# Patient Record
Sex: Male | Born: 1970 | Race: White | Hispanic: No | Marital: Married | Smoking: Former smoker
Health system: Southern US, Community
[De-identification: ages and names within clinical notes are randomized; demographics above are authoritative.]

## PROBLEM LIST (undated history)

## (undated) DIAGNOSIS — G4733 Obstructive sleep apnea (adult) (pediatric): Secondary | ICD-10-CM

## (undated) DIAGNOSIS — J45909 Unspecified asthma, uncomplicated: Secondary | ICD-10-CM

## (undated) DIAGNOSIS — E669 Obesity, unspecified: Secondary | ICD-10-CM

## (undated) DIAGNOSIS — I10 Essential (primary) hypertension: Secondary | ICD-10-CM

## (undated) HISTORY — DX: Unspecified asthma, uncomplicated: J45.909

## (undated) HISTORY — PX: CARPAL TUNNEL RELEASE: SHX101

## (undated) HISTORY — PX: ADENOIDECTOMY: SUR15

## (undated) HISTORY — DX: Obstructive sleep apnea (adult) (pediatric): G47.33

## (undated) HISTORY — DX: Obesity, unspecified: E66.9

## (undated) HISTORY — PX: TONSILLECTOMY: SUR1361

## (undated) HISTORY — DX: Essential (primary) hypertension: I10

---

## 2004-10-27 ENCOUNTER — Ambulatory Visit: Payer: Self-pay | Admitting: Internal Medicine

## 2005-04-12 ENCOUNTER — Ambulatory Visit: Payer: Self-pay | Admitting: Internal Medicine

## 2005-08-08 DIAGNOSIS — I10 Essential (primary) hypertension: Secondary | ICD-10-CM

## 2005-08-08 HISTORY — DX: Essential (primary) hypertension: I10

## 2005-11-01 ENCOUNTER — Ambulatory Visit: Payer: Self-pay | Admitting: Internal Medicine

## 2005-11-01 ENCOUNTER — Encounter: Admission: RE | Admit: 2005-11-01 | Discharge: 2005-11-01 | Payer: Self-pay | Admitting: Internal Medicine

## 2006-03-24 ENCOUNTER — Ambulatory Visit: Payer: Self-pay | Admitting: Internal Medicine

## 2006-04-17 ENCOUNTER — Encounter: Admission: RE | Admit: 2006-04-17 | Discharge: 2006-07-16 | Payer: Self-pay | Admitting: Internal Medicine

## 2006-06-06 ENCOUNTER — Ambulatory Visit: Payer: Self-pay | Admitting: Internal Medicine

## 2006-06-20 ENCOUNTER — Ambulatory Visit: Payer: Self-pay | Admitting: Internal Medicine

## 2006-08-09 ENCOUNTER — Encounter: Admission: RE | Admit: 2006-08-09 | Discharge: 2006-11-07 | Payer: Self-pay | Admitting: Internal Medicine

## 2006-09-26 ENCOUNTER — Ambulatory Visit: Payer: Self-pay | Admitting: Internal Medicine

## 2006-09-30 DIAGNOSIS — E669 Obesity, unspecified: Secondary | ICD-10-CM | POA: Insufficient documentation

## 2007-07-11 ENCOUNTER — Telehealth (INDEPENDENT_AMBULATORY_CARE_PROVIDER_SITE_OTHER): Payer: Self-pay | Admitting: *Deleted

## 2007-07-11 ENCOUNTER — Encounter (INDEPENDENT_AMBULATORY_CARE_PROVIDER_SITE_OTHER): Payer: Self-pay | Admitting: *Deleted

## 2007-09-03 ENCOUNTER — Telehealth (INDEPENDENT_AMBULATORY_CARE_PROVIDER_SITE_OTHER): Payer: Self-pay | Admitting: *Deleted

## 2007-09-10 ENCOUNTER — Ambulatory Visit: Payer: Self-pay | Admitting: Internal Medicine

## 2007-09-10 DIAGNOSIS — I10 Essential (primary) hypertension: Secondary | ICD-10-CM

## 2007-12-10 ENCOUNTER — Ambulatory Visit: Payer: Self-pay | Admitting: Internal Medicine

## 2007-12-14 ENCOUNTER — Ambulatory Visit: Payer: Self-pay | Admitting: Internal Medicine

## 2007-12-18 ENCOUNTER — Telehealth: Payer: Self-pay | Admitting: Internal Medicine

## 2008-03-13 ENCOUNTER — Ambulatory Visit: Payer: Self-pay | Admitting: Internal Medicine

## 2008-03-13 LAB — CONVERTED CEMR LAB
Bilirubin Urine: NEGATIVE
Blood in Urine, dipstick: NEGATIVE
Glucose, Urine, Semiquant: NEGATIVE
Nitrite: NEGATIVE
Protein, U semiquant: NEGATIVE
Specific Gravity, Urine: <1.005
Urobilinogen, UA: 0.2
pH: 6

## 2008-03-17 ENCOUNTER — Encounter (INDEPENDENT_AMBULATORY_CARE_PROVIDER_SITE_OTHER): Payer: Self-pay | Admitting: *Deleted

## 2008-03-17 LAB — CONVERTED CEMR LAB
Basophils Absolute: 0.1 10*3/uL (ref 0.0–0.1)
Basophils Relative: 2.5 % (ref 0.0–3.0)
Monocytes Absolute: 0.4 10*3/uL (ref 0.1–1.0)
Neutro Abs: 3.4 10*3/uL (ref 1.4–7.7)
Neutrophils Relative %: 64.3 % (ref 43.0–77.0)

## 2008-04-24 ENCOUNTER — Telehealth (INDEPENDENT_AMBULATORY_CARE_PROVIDER_SITE_OTHER): Payer: Self-pay | Admitting: *Deleted

## 2008-11-10 ENCOUNTER — Telehealth (INDEPENDENT_AMBULATORY_CARE_PROVIDER_SITE_OTHER): Payer: Self-pay | Admitting: *Deleted

## 2009-07-27 ENCOUNTER — Telehealth (INDEPENDENT_AMBULATORY_CARE_PROVIDER_SITE_OTHER): Payer: Self-pay | Admitting: *Deleted

## 2009-07-28 ENCOUNTER — Ambulatory Visit: Payer: Self-pay | Admitting: Family

## 2009-08-26 ENCOUNTER — Ambulatory Visit: Payer: Self-pay | Admitting: Family

## 2009-08-26 LAB — CONVERTED CEMR LAB
AST: 28 units/L (ref 0–37)
Basophils Absolute: 0.1 10*3/uL (ref 0.0–0.1)
Bilirubin, Direct: 0 mg/dL (ref 0.0–0.3)
CO2: 32 meq/L (ref 19–32)
Chloride: 103 meq/L (ref 96–112)
Creatinine, Ser: 0.8 mg/dL (ref 0.4–1.5)
Eosinophils Relative: 1.2 % (ref 0.0–5.0)
GFR calc non Af Amer: 113.28 mL/min (ref >60)
Hemoglobin: 15.4 g/dL (ref 13.0–17.0)
MCHC: 33 g/dL (ref 30.0–36.0)
MCV: 90.4 fL (ref 78.0–100.0)
Monocytes Relative: 7.4 % (ref 3.0–12.0)
RBC: 5.16 M/uL (ref 4.22–5.81)
Sodium: 144 meq/L (ref 135–145)
TSH: 1.1 microintl units/mL (ref 0.35–5.50)
Total Protein: 7.3 g/dL (ref 6.0–8.3)
WBC: 8.6 10*3/uL (ref 4.5–10.5)

## 2009-08-27 ENCOUNTER — Encounter: Payer: Self-pay | Admitting: Family

## 2009-09-21 ENCOUNTER — Telehealth: Payer: Self-pay | Admitting: Family

## 2010-01-05 ENCOUNTER — Telehealth (INDEPENDENT_AMBULATORY_CARE_PROVIDER_SITE_OTHER): Payer: Self-pay | Admitting: *Deleted

## 2010-01-22 ENCOUNTER — Ambulatory Visit: Payer: Self-pay | Admitting: Internal Medicine

## 2010-01-26 ENCOUNTER — Ambulatory Visit: Payer: Self-pay | Admitting: Internal Medicine

## 2010-01-26 DIAGNOSIS — G4733 Obstructive sleep apnea (adult) (pediatric): Secondary | ICD-10-CM

## 2010-01-27 LAB — CONVERTED CEMR LAB
CO2: 32 meq/L (ref 19–32)
Calcium: 9.4 mg/dL (ref 8.4–10.5)
Cholesterol: 229 mg/dL — ABNORMAL HIGH (ref 0–200)
Creatinine, Ser: 0.9 mg/dL (ref 0.4–1.5)
GFR calc non Af Amer: 101.27 mL/min (ref >60)
Total CHOL/HDL Ratio: 6

## 2010-03-09 ENCOUNTER — Ambulatory Visit (HOSPITAL_BASED_OUTPATIENT_CLINIC_OR_DEPARTMENT_OTHER): Admission: RE | Admit: 2010-03-09 | Discharge: 2010-03-09 | Payer: Self-pay | Admitting: Internal Medicine

## 2010-03-09 ENCOUNTER — Encounter: Payer: Self-pay | Admitting: Internal Medicine

## 2010-03-13 ENCOUNTER — Ambulatory Visit: Payer: Self-pay | Admitting: Internal Medicine

## 2010-04-15 ENCOUNTER — Ambulatory Visit: Payer: Self-pay | Admitting: Internal Medicine

## 2010-04-15 DIAGNOSIS — J018 Other acute sinusitis: Secondary | ICD-10-CM

## 2010-04-23 ENCOUNTER — Encounter: Payer: Self-pay | Admitting: Internal Medicine

## 2010-04-27 ENCOUNTER — Encounter: Payer: Self-pay | Admitting: Internal Medicine

## 2010-05-14 ENCOUNTER — Ambulatory Visit: Payer: Self-pay | Admitting: Internal Medicine

## 2010-05-16 ENCOUNTER — Encounter: Payer: Self-pay | Admitting: Internal Medicine

## 2010-07-20 ENCOUNTER — Encounter: Payer: Self-pay | Admitting: Internal Medicine

## 2010-09-06 ENCOUNTER — Ambulatory Visit: Admit: 2010-09-06 | Payer: Self-pay | Admitting: Internal Medicine

## 2010-09-07 NOTE — Progress Notes (Signed)
Summary: Refill Request  Phone Note Refill Request Call back at [phone] Message from:  Pharmacy on Jan 05, 2010 9:29 AM  Refills Requested: Medication #1:  FELODIPINE 10 MG XR24H-TAB one tablet by mouth daily -   Dosage confirmed as above?Dosage Confirmed   Supply Requested: 1 month   Last Refilled: 11/30/2009  Medication #2:  ZESTRIL 10 MG TABS one tablet by mouth daily.   Dosage confirmed as above?Dosage Confirmed   Brand Name Necessary? No   Supply Requested: 1 month   Last Refilled: 11/30/2009 Karin Golden on [address].   Next Appointment Scheduled: 6.17.11 Initial call taken by: Harold Barban,  Jan 05, 2010 9:29 AM  Follow-up for Phone Call        has cpx 7/11....Marland KitchenMarland KitchenShary Decamp  Jan 05, 2010 2:25 PM     Prescriptions: ZESTRIL 10 MG TABS (LISINOPRIL) one tablet by mouth daily  #30 x 1   Entered by:   Shary Decamp   Authorized by:   Nolon Rod. Paz MD   Signed by:   Shary Decamp on 01/05/2010   Method used:   Electronically to        [facility] (retail)       [address]       [place], [place]  [address]       Ph: [phone]       Fax: [phone]   RxID:   [id number] FELODIPINE 10 MG XR24H-TAB (FELODIPINE) one tablet by mouth daily -  #30 x 1   Entered by:   Shary Decamp   Authorized by:   Nolon Rod. Paz MD   Signed by:   Shary Decamp on 01/05/2010   Method used:   Electronically to        [facility] (retail)       [address]       [place], [place]  [address]       Ph: [phone]       Fax: [phone]   RxID:   [id number]

## 2010-09-07 NOTE — Progress Notes (Signed)
Summary: refill request  Phone Note Refill Request Message from:  Fax from Pharmacy on September 21, 2009 10:55 AM  Refills Requested: Medication #1:  FELODIPINE 10 MG XR24H-TAB one tablet by mouth daily   Dosage confirmed as above?Dosage Confirmed   Brand Name Necessary? No   Supply Requested: 1 month   Last Refilled: 08/27/2009  Method Requested: Electronic Next Appointment Scheduled: none Initial call taken by: Roselle Locus,  September 21, 2009 10:56 AM    New/Updated Medications: FELODIPINE 10 MG XR24H-TAB (FELODIPINE) one tablet by mouth daily - NEEDS OFFICE VISIT FOR ADDITIONAL REFILLS Prescriptions: FELODIPINE 10 MG XR24H-TAB (FELODIPINE) one tablet by mouth daily - NEEDS OFFICE VISIT FOR ADDITIONAL REFILLS  #30 x 0   Entered by:   Shary Decamp   Authorized by:   Nolon Rod. Paz MD   Signed by:   Shary Decamp on 09/21/2009   Method used:   Electronically to        [facility] (retail)       [address]       [place], [place]  [address]       Ph: [phone]       Fax: [phone]   RxID:   [id number]

## 2010-09-07 NOTE — Assessment & Plan Note (Signed)
Summary: cpx/kdc   Vital Signs:  Patient profile:   40 year old male Height:      68 inches Weight:      344 pounds BMI:     52.49 Temp:     97.6 degrees F oral Pulse rate:   92 / minute BP sitting:   110 / 78  (left arm)  Vitals Entered By: Jeremy Johann CMA (January 22, 2010 8:25 AM) CC: cpx Comments --Fasting --refills REVIEWED MED LIST, PATIENT AGREED DOSE AND INSTRUCTION CORRECT    History of Present Illness: CPX his only concern today is his poor sleeping habits, wakes up through the night His wife has noted him to stop breathing from time to time, he already contacted the sleep apnea clinic and we'll see Dr. Maple Hudson  Allergies (verified): No Known Drug Allergies  Past History:  Past Medical History: Hypertension (2007) obesity  Past Surgical History: Reviewed history from 09/10/2007 and no changes required. Carpal tunnel release , bilateral adenoidectomy  Family History: Reviewed history from 09/10/2007 and no changes required. DM - GM MI - GF prostate Ca - no colon ca - no HTN - M CAD - F had cardiomegaly +smoker  Social History: Divorced, remarried  2 kids Never Smoked Alcohol use-yes exercise--  rarely , trying to do more  diet-- "not the greatest"  Review of Systems CV:  Denies chest pain or discomfort and swelling of feet. Resp:  Denies cough and shortness of breath. GI:  Denies bloody stools, diarrhea, nausea, and vomiting. GU:  Denies dysuria, nocturia, urinary frequency, and urinary hesitancy. Psych:  Denies anxiety; no sadness but has lost interest in some hobbys, thinksb/c he is tired all the time .  Physical Exam  General:  alert, well-developed, and overweight-appearing.   Neck:  no masses.   Lungs:  normal respiratory effort, no intercostal retractions, no accessory muscle use, and normal breath sounds.   Heart:  normal rate, regular rhythm, no murmur, and no gallop.   Abdomen:  soft, non-tender, no distention, no masses, no  guarding, and no rigidity.   Extremities:  trace pretibial edema bilaterally, symmetric Neurologic:  alert & oriented X3, strength normal in all extremities, and gait normal.   Psych:  Cognition and judgment appear intact. Alert and cooperative with normal attention span and concentration.  not anxious appearing and not depressed appearing.     Impression & Recommendations:  Problem # 1:  PREVENTIVE HEALTH CARE (ICD-V70.0) Td 2002 never had a colonoscopy labs I advised the patient that his main medical problem is his weight, encouraged him to change his diet and exercise more. BMI in the 50s Options: Optifast, Weight Watchers,  self diet. He is to exercise at least 30 minutes daily. Consequences of obesity discussed to have a sleep study soon, see HPI Orders: Venipuncture ([address]) TLB-Lipid Panel (80061-LIPID) TLB-BMP (Basic Metabolic Panel-BMET) (80048-METABOL)  Complete Medication List: 1)  Felodipine 10 Mg Xr24h-tab (Felodipine) .... One tablet by mouth daily - 2)  Zestril 10 Mg Tabs (Lisinopril) .... One tablet by mouth daily  Patient Instructions: 1)  Please schedule a follow-up appointment in 6 months .  Prescriptions: ZESTRIL 10 MG TABS (LISINOPRIL) one tablet by mouth daily  #90 x 3   Entered and Authorized by:   Nolon Rod. Jeree Delcid MD   Signed by:   Nolon Rod. Kayn Haymore MD on 01/22/2010   Method used:   Electronically to        [facility] (retail)  [address]       [place], [place]  [address]       Ph: [phone]       Fax: [phone]   RxID:   [id number] FELODIPINE 10 MG XR24H-TAB (FELODIPINE) one tablet by mouth daily -  #90 x 3   Entered and Authorized by:   Nolon Rod. Laith Antonelli MD   Signed by:   Nolon Rod. Taitum Menton MD on 01/22/2010   Method used:   Electronically to        [facility] (retail)       [address]       [place], [place]  [address]       Ph: [phone]       Fax:  [phone]   RxID:   [id number]

## 2010-09-07 NOTE — Miscellaneous (Signed)
Summary: CPAP Set Up/[facility]  CPAP Set Up/[facility]   Imported By: Sherian Rein 04/29/2010 11:44:06  _____________________________________________________________________  External Attachment:    Type:   Image     Comment:   External Document

## 2010-09-07 NOTE — Assessment & Plan Note (Signed)
Summary: POSSIBLE SLEEP APNEA///kp   Primary Provider/Referring Provider:  Nolon Rod. Paz MD  CC:  possible sleep apnea.  History of Present Illness: January 26, 2010- 40 yo M , self referred with long hx of fragmented sleep, daytime sleepiness, loud snoring with weight gain. He is concerned about possible sleep apnea. Wife tells him he stops breathing. Married this Spring, but says sleep has been retless all his life. Sleepy if he sits quietly, but he insists he has no problem while driving and we discussed his reponsibility to be a safe, alert driver. Bedtime 9-10 PM, short sleep latency, waking 6-8x before up between 5-6AM. 50-60 lb weight gain in 2 years but not sure how snoring changed with this. Thyroid tested normal. Adenoids out age 40 yo. Father snores.  Preventive Screening-Counseling & Management  Alcohol-Tobacco     Alcohol drinks/day: 3-4 drinks a week     Alcohol type: beer     Smoking Status: quit > 6 months     Year Quit: 2000  Current Medications (verified): 1)  Felodipine 10 Mg Xr24h-Tab (Felodipine) .... One Tablet By Mouth Daily - 2)  Zestril 10 Mg Tabs (Lisinopril) .... One Tablet By Mouth Daily  Allergies (verified): No Known Drug Allergies  Past History:  Family history reviewed for relevance to current acute and chronic problems.  family history astma FH reviewed for relevance  Family History: Reviewed history from 09/10/2007 and no changes required. 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  The patient denies shortness of breath with activity, shortness  of breath at rest, productive cough, non-productive cough, coughing up blood, chest pain, irregular heartbeats, acid heartburn, indigestion, loss of appetite, weight change, abdominal pain, difficulty swallowing, sore throat, tooth/dental problems, headaches, nasal congestion/difficulty breathing through nose, sneezing, itching, ear ache, anxiety, depression, joint stiffness or pain, rash, change in color of mucus, and fever.    Vital Signs:  Patient profile:   40 year old male Height:      68 inches Weight:      338 pounds BMI:     51.58 O2 Sat:      96 % on Room air Pulse rate:   112 / minute BP sitting:   126 / 88  (right arm) Cuff size:   large  Vitals Entered By: Kandice Hams (January 26, 2010 2:37 PM)  O2 Flow:  Room air CC: possible sleep apnea   Physical Exam  Additional Exam:  General: A/Ox3; pleasant and cooperative, NAD, overweight, alert SKIN: no rash, lesions NODES: no lymphadenopathy HEENT: [place]/AT, EOM- WNL, Conjuctivae- clear, PERRLA, TM-WNL, Nose- clear, Throat- clear and wnl, Mallampati  III-IV, tonsils not large NECK: Supple w/ fair ROM, JVD- none, normal carotid impulses w/o bruits Thyroid- normal to palpation CHEST: Clear to P&A HEART: RRR, no m/g/r heard ABDOMEN: Soft and nl; nml bowel sounds; no organomegaly or masses noted. significantly overweight ZOX:WRUE, nl pulses, no edema  NEURO: Grossly intact to observation      Impression &  Recommendations:  Problem # 1:  OBSTRUCTIVE SLEEP APNEA (ICD-327.23)  High risk for significant OSA based on Hx, Exam. We carefuly discussed the physiology, medical concerns and diagnostic workup. He agrees and we will schedule a sleep study.  Problem # 2:  OBESITY NOS (ICD-278.00) We discussed weight gain as a risk factor for sleep apnea,and a magnifier of the morbidities associated with OSA. He will need to make significant life-style changes to improve.  Other Orders: Consultation Level IV ([address]) Sleep Disorder  Referral (Sleep Disorder)  Patient Instructions: 1)  Please schedule a follow-up appointment in 1 month or later, to follow up on sleep study 2)  See [facility] to schedule sleep study.

## 2010-09-07 NOTE — Assessment & Plan Note (Signed)
Summary: rov 1 month///kp   Primary Provider/Referring Provider:  Nolon Rod. Paz MD  CC:  1 month follow up visit-sleep apnea.  History of Present Illness: History of Present Illness: January 26, 2010- 39 yo M , self referred with long hx of fragmented sleep, daytime sleepiness, loud snoring with weight gain. He is concerned about possible sleep apnea. Wife tells him he stops breathing. Married this Spring, but says sleep has been retless all his life. Sleepy if he sits quietly, but he insists he has no problem while driving and we discussed his reponsibility to be a safe, alert driver. Bedtime 9-10 PM, short sleep latency, waking 6-8x before up between 5-6AM. 50-60 lb weight gain in 2 years but not sure how snoring changed with this. Thyroid tested normal. Adenoids out age 30 yo. Father snores.  April 15, 2010- OSA,  Sleep study f/u [facility]PSG 03/09/10- AHI 99.9/hr, CPAP titrated to 21 cwp/ AHI 0.9/hr. Report reviewed with him. We discussed medical implications, treatments and CPAP options. Now on amoxacillin for a sinus infection with pressure pain now beginning to improve.   May 14, 2010- OSA cc: 1 month follow up visit-sleep apnea    CPAP is still autotitrating. He likes the CPAP "its great, no problems". Notes minor mask leak issues- discussed. He sleeps better and is no longer drowsy. Wife reported he is not snoring. Full face mask and humidifier.     Preventive Screening-Counseling & Management  Alcohol-Tobacco     Alcohol drinks/day: 3-4 drinks a week     Alcohol type: beer     Smoking Status: quit > 6 months     Year Quit: 2000  Current Medications (verified): 1)  Felodipine 10 Mg Xr24h-Tab (Felodipine) .... One Tablet By Mouth Daily - 2)  Zestril 10 Mg Tabs (Lisinopril) .... One Tablet By Mouth Daily 3)  Cpap .... Ahc Set 21cm  Allergies (verified): No Known Drug Allergies  Past History:  Past Medical History: Last updated: 04/15/2010 Hypertension  (2007) obesity Obstructive Sleep apnea  NPSG 03/09/10  AHI 99.9/hr, CPAP 21/ AHI 0.9/hr  Past Surgical History: Last updated: 09/10/2007 Carpal tunnel release , bilateral adenoidectomy  Family History: Last updated: 09/10/2007 DM - GM MI - GF prostate Ca - no colon ca - no HTN - M CAD - F had cardiomegaly +smoker  Social History: Last updated: 01/26/2010 Divorced, remarried  4 kids Quit smoking 2000 Alcohol use-yes- 3 to 4 beers/ week exercise--  rarely , trying to do more  diet-- "not the greatest" Engineer- chips for cell phones  Risk Factors: Alcohol Use: 3-4 drinks a week (05/14/2010)  Risk Factors: Smoking Status: quit > 6 months (05/14/2010)  Review of Systems      See HPI  The patient denies shortness of breath with activity, shortness of breath at rest, productive cough, non-productive cough, coughing up blood, chest pain, irregular heartbeats, acid heartburn, indigestion, loss of appetite, weight change, abdominal pain, difficulty swallowing, sore throat, tooth/dental problems, headaches, nasal congestion/difficulty breathing through nose, and sneezing.    Vital Signs:  Patient profile:   40 year old male Height:      68 inches Weight:      343 pounds BMI:     52.34 O2 Sat:      97 % on Room air Pulse rate:   99 / minute BP sitting:   130 / 74  (right arm) Cuff size:   large  Vitals Entered By: Reynaldo Minium CMA (May 14, 2010  2:45 PM)  O2 Flow:  Room air CC: 1 month follow up visit-sleep apnea   Physical Exam  Additional Exam:  General: A/Ox3; pleasant and cooperative, NAD, overweight, alert, cheerful SKIN: no rash, lesions NODES: no lymphadenopathy HEENT: [place]/AT, EOM- WNL, Conjuctivae- clear, PERRLA, TM-WNL, Nose- congested sniffing, Throat- clear and wnl, Mallampati  III-IV, tonsils not large. Oral mucosa red from medication NECK: Supple w/ fair ROM, JVD- none, normal carotid impulses w/o bruits Thyroid- normal to palpation CHEST: Clear to  P&A HEART: RRR, no m/g/r heard ABDOMEN: Soft and nl; . significantly overweight ZOX:WRUE, nl pulses, no edema  NEURO: Grossly intact to observation      Impression & Recommendations:  Problem # 1:  OBSTRUCTIVE SLEEP APNEA (ICD-327.23)  Good compliance and control  When the download is available, I will switch to a fixed cpap setting as discussed. He will let us know it there are problems.l  Problem # 2:  OBESITY NOS (ICD-278.00)  Weight loss is encouraged, as it would likey help both sleep apnea and HTN.  Medications Added to Medication List This Visit: 1)  Cpap  .... Ahc set 21cm  Other Orders: Est. Patient Level III ([address])  Patient Instructions: 1)  Please schedule a follow-up appointment in 3 months. 2)  I will probably ask the home care company to change to a fixed CPAP pressure when the download comes in (after you turn in the card to [facility]). If you don't like the changes, let me know.

## 2010-09-07 NOTE — Letter (Signed)
   [facility] [address] [place], [place] [address] [phone]    August 27, 2009   TELESFORO BROSNAHAN [address] [place], [place] [address]  RE:  LAB RESULTS  Dear  Mr. BUFANO,  The following is an interpretation of your most recent lab tests.  Please take note of any instructions provided or changes to medications that have resulted from your lab work.  ELECTROLYTES:  Good - no changes needed  KIDNEY FUNCTION TESTS:  Good - no changes needed  LIVER FUNCTION TESTS:  Good - no changes needed  THYROID STUDIES:  Thyroid studies normal TSH: 1.10     DIABETIC STUDIES:  Excellent - no changes needed Blood Glucose: 80    CBC:  Good - no changes needed   Sincerely Yours,    Lemont Fillers FNP

## 2010-09-07 NOTE — Assessment & Plan Note (Signed)
Summary: Aaron Whitehead after sleep study ///kp   Primary Provider/Referring Provider:  Nolon Rod. Paz MD  CC:  Follow up visit-review Sleep study..  History of Present Illness: January 26, 2010- 40 yo M , self referred with long hx of fragmented sleep, daytime sleepiness, loud snoring with weight gain. He is concerned about possible sleep apnea. Wife tells him he stops breathing. Married this Spring, but says sleep has been retless all his life. Sleepy if he sits quietly, but he insists he has no problem while driving and we discussed his reponsibility to be a safe, alert driver. Bedtime 9-10 PM, short sleep latency, waking 6-8x before up between 5-6AM. 50-60 lb weight gain in 2 years but not sure how snoring changed with this. Thyroid tested normal. Adenoids out age 40 yo. Father snores.  April 15, 2010- OSA,  Sleep study f/u [facility] 03/09/10- AHI 99.9/hr, CPAP titrated to 21 cwp/ AHI 0.9/hr. Report reviewed with him.We discussed medical implications, treatments and CPAP options. Now on amoxacillin for a sinus infection with pressure pain now beginning to improve.    Preventive Screening-Counseling & Management  Alcohol-Tobacco     Alcohol drinks/day: 3-4 drinks a week     Alcohol type: beer     Smoking Status: quit > 6 months     Year Quit: 2000  Current Medications (verified): 1)  Felodipine 10 Mg Xr24h-Tab (Felodipine) .... One Tablet By Mouth Daily - 2)  Zestril 10 Mg Tabs (Lisinopril) .... One Tablet By Mouth Daily  Allergies (verified): No Known Drug Allergies  Past History:  Past Surgical History: Last updated: 09/10/2007 Carpal tunnel release , bilateral adenoidectomy  Family History: Last updated: 09/10/2007 DM - GM MI - GF prostate Ca - no colon ca - no HTN - M CAD - F had cardiomegaly +smoker  Social History: Last updated: 01/26/2010 Divorced, remarried  4 kids Quit smoking 2000 Alcohol use-yes- 3 to 4 beers/ week exercise--  rarely , trying to do more  diet-- "not  the greatest" Engineer- chips for cell phones  Risk Factors: Alcohol Use: 3-4 drinks a week (04/15/2010)  Risk Factors: Smoking Status: quit > 6 months (04/15/2010)  Past Medical History: Hypertension (2007) obesity Obstructive Sleep apnea  NPSG 03/09/10  AHI 99.9/hr, CPAP 21/ AHI 0.9/hr  Review of Systems      See HPI  The patient denies anorexia, fever, weight loss, weight gain, vision loss, decreased hearing, hoarseness, chest pain, syncope, dyspnea on exertion, peripheral edema, prolonged cough, headaches, hemoptysis, abdominal pain, and severe indigestion/heartburn.    Vital Signs:  Patient profile:   40 year old male Height:      68 inches Weight:      353.13 pounds BMI:     53.89 O2 Sat:      97 % on Room air Pulse rate:   98 / minute BP sitting:   110 / 68  (left arm) Cuff size:   large  Vitals Entered By: Reynaldo Minium CMA (April 15, 2010 3:21 PM)  O2 Flow:  Room air CC: Follow up visit-review Sleep study.   Physical Exam  Additional Exam:  General: A/Ox3; pleasant and cooperative, NAD, overweight, alert SKIN: no rash, lesions NODES: no lymphadenopathy HEENT: [place]/AT, EOM- WNL, Conjuctivae- clear, PERRLA, TM-WNL, Nose- congested sniffing, Throat- clear and wnl, Mallampati  III-IV, tonsils not large. Oral mucosa red from medication NECK: Supple w/ fair ROM, JVD- none, normal carotid impulses w/o bruits Thyroid- normal to palpation CHEST: Clear to P&A HEART: RRR, no  m/g/r heard ABDOMEN: Soft and nl; nml bowel sounds; no organomegaly or masses noted. significantly overweight ZOX:WRUE, nl pulses, no edema  NEURO: Grossly intact to observation      Impression & Recommendations:  Problem # 1:  OBSTRUCTIVE SLEEP APNEA (ICD-327.23)  Severe OSA. He is intelligent and should do well with CPAP. The pressure is high enough that he will need a bilevel capable machine, but he doesn't think he had a problem with it at the sleep center so we will keep it simple to  start.  We have discussed weight loss, driving safety and alternatives to CPAP.  Problem # 2:  RHINOSINUSITIS, ACUTE (ICD-461.8) This should clear, but we will watch for related symptoms ashe starts high pressure PAP therapy.  Other Orders: Est. Patient Level III ([address]) DME Referral (DME)  Patient Instructions: 1)  Please schedule a follow-up appointment in 1 month. 2)  See the [facility] to get hooked up with a home care DME company for start with CPAP. 3)  Call if you find you need a sleep aid.

## 2010-09-07 NOTE — Letter (Signed)
Summary: [facility]  [facility]   Imported By: Lester [place] 05/04/2010 08:19:54  _____________________________________________________________________  External Attachment:    Type:   Image     Comment:   External Document
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
## 2010-10-25 ENCOUNTER — Encounter: Payer: Self-pay | Admitting: Internal Medicine

## 2010-10-25 ENCOUNTER — Ambulatory Visit (INDEPENDENT_AMBULATORY_CARE_PROVIDER_SITE_OTHER): Payer: BC Managed Care – PPO | Admitting: Internal Medicine

## 2010-10-25 DIAGNOSIS — G4733 Obstructive sleep apnea (adult) (pediatric): Secondary | ICD-10-CM

## 2010-11-04 NOTE — Assessment & Plan Note (Signed)
Summary: 3 month rov reschd from 09/06/2010//sh   Primary Provider/Referring Provider:  Nolon Rod. Paz MD  CC:  Follow up visit-sleep apnea; doing well and using CPAP every night..  History of Present Illness:  April 15, 2010- OSA,  Sleep study f/u NPSG 03/09/10- AHI 99.9/hr, CPAP titrated to 21 cwp/ AHI 0.9/hr. Report reviewed with him. We discussed medical implications, treatments and CPAP options. Now on amoxacillin for a sinus infection with pressure pain now beginning to improve.   May 14, 2010- OSA cc: 1 month follow up visit-sleep apnea    CPAP is still autotitrating. He likes the CPAP "its great, no problems". Notes minor mask leak issues- discussed. He sleeps better and is no longer drowsy. Wife reported he is not snoring. Full face mask and humidifier.   October 25, 2010- OSA Nurse-CC: Follow up visit-sleep apnea; doing well and using CPAP every night. CPAP- "great,  love it". Set at 21 CPAP with a Bilevel machine, because of high pressure requirement,  through [facility]. Full face mask. He denies issues. Says wife is happy with it.  Has joined a gym and expresses goal of weight loss.     Preventive Screening-Counseling & Management  Alcohol-Tobacco     Alcohol drinks/day: 3-4 drinks a week     Alcohol type: beer     Smoking Status: quit > 6 months     Year Quit: 2000  Current Medications (verified): 1)  Felodipine 10 Mg Xr24h-Tab (Felodipine) .... One Tablet By Mouth Daily - 2)  Zestril 10 Mg Tabs (Lisinopril) .... One Tablet By Mouth Daily 3)  Cpap .... Ahc Set 21cm  Allergies (verified): No Known Drug Allergies  Past History:  Past Medical History: Last updated: 04/15/2010 Hypertension (2007) obesity Obstructive Sleep apnea  NPSG 03/09/10  AHI 99.9/hr, CPAP 21/ AHI 0.9/hr  Past Surgical History: Last updated: 09/10/2007 Carpal tunnel release , bilateral adenoidectomy  Family History: Last updated: 09/10/2007 DM - GM MI - GF prostate Ca - no colon  ca - no HTN - M CAD - F had cardiomegaly +smoker  Social History: Last updated: 01/26/2010 Divorced, remarried  4 kids Quit smoking 2000 Alcohol use-yes- 3 to 4 beers/ week exercise--  rarely , trying to do more  diet-- "not the greatest" Engineer- chips for cell phones  Risk Factors: Alcohol Use: 3-4 drinks a week (10/25/2010)  Risk Factors: Smoking Status: quit > 6 months (10/25/2010)  Review of Systems      See HPI  The patient denies shortness of breath with activity, shortness of breath at rest, productive cough, non-productive cough, coughing up blood, chest pain, irregular heartbeats, acid heartburn, indigestion, loss of appetite, weight change, abdominal pain, difficulty swallowing, sore throat, tooth/dental problems, headaches, nasal congestion/difficulty breathing through nose, and sneezing.    Vital Signs:  Patient profile:   40 year old male Height:      68 inches Weight:      349.13 pounds BMI:     53.28 O2 Sat:      97 % on Room air Pulse rate:   90 / minute BP sitting:   122 / 80  (left arm) Cuff size:   large  Vitals Entered By: Reynaldo Minium CMA (October 25, 2010 3:34 PM)  O2 Flow:  Room air CC: Follow up visit-sleep apnea; doing well and using CPAP every night.   Physical Exam  Additional Exam:  General: A/Ox3; pleasant and cooperative, NAD,  very overweight, alert, cheerful SKIN: no rash, lesions NODES:  no lymphadenopathy HEENT: [place]/AT, EOM- WNL, Conjuctivae- clear, PERRLA, TM-WNL, Nose- congested sniffing, Throat- clear and wnl, Mallampati  III-IV, tonsils not large. Oral mucosa red from medication NECK: Supple w/ fair ROM, JVD- none, normal carotid impulses w/o bruits Thyroid- normal to palpation CHEST: Clear to P&A HEART: RRR, no m/g/r heard ABDOMEN: Soft and nl; . significantly overweight ZOX:WRUE, nl pulses, no edema  NEURO: Grossly intact to observation      Impression & Recommendations:  Problem # 1:  OBSTRUCTIVE SLEEP APNEA  (ICD-327.23)  Great compliance and control with high pressure CPAP, which he is tolerating well.   Now he needs to work on his weight. Meaningful weight loss should allow him to reduce pressures.  We talked about equipment maintnenance and he will discuss filters and such with [facility].,   Problem # 2:  RHINOSINUSITIS, ACUTE (ICD-461.8) Denies significant seasonal rhintis at this time.  Other Orders: Est. Patient Level III (45409)  Patient Instructions: 1)  Please schedule a follow-up appointment in 1 year- needed for CPAP follow-up documentation 2)  Please call sooner if needed

## 2011-01-19 ENCOUNTER — Encounter: Payer: Self-pay | Admitting: Internal Medicine

## 2011-01-20 ENCOUNTER — Ambulatory Visit (INDEPENDENT_AMBULATORY_CARE_PROVIDER_SITE_OTHER): Payer: BC Managed Care – PPO | Admitting: Internal Medicine

## 2011-01-20 ENCOUNTER — Encounter: Payer: Self-pay | Admitting: Internal Medicine

## 2011-01-20 DIAGNOSIS — H109 Unspecified conjunctivitis: Secondary | ICD-10-CM | POA: Insufficient documentation

## 2011-01-20 MED ORDER — CIPROFLOXACIN HCL 0.3 % OP SOLN: 1.0000 [drp] | OPHTHALMIC | Status: AC

## 2011-01-20 NOTE — Progress Notes (Signed)
  Subjective:    Patient ID: Aaron Whitehead, male    DOB: Dec 30, 1970, 40 y.o.   MRN: [id number]  HPI Symptoms started 4 days ago with a FB feeling in the R eye The next day he noticed redness and some discharge Yesterday the eye lid was slightly swollen and its  worse today.  Past Medical History  Diagnosis Date  . Hypertension 2007  . Obesity   . OSA (obstructive sleep apnea)     NPSG 03/09/10 AHI 99.9/hr, CPAP 21/AHI 0.9/hr   Past Surgical History  Procedure Date  . Carpal tunnel release     bilat  . Adenoidectomy      Review of Systems Has not been exposed to any FB that he recalls. Does not use contacts The eye itch and hurt a little. The vision is  slight blurred "due to the discharge". Yesterday he started to use over-the-counter allergy drops No photophobia     Objective:   Physical Exam Alert, oriented in no apparent distress EOMI, pupils equal and reactive to light and accommodation Left eye normal Right eye: Conjunctiva is red, slightly swollen, mild yellow discharge. Anterior chamber is normal, fluid is clear. Fluorescein exam showed no FB or corneal scratch       Assessment & Plan:

## 2011-01-20 NOTE — Assessment & Plan Note (Signed)
Red eye and likely from conjunctivitis, no evidence of iritis or other  serious process. See instructions He plans to leave town in a couple of days, we agreed that if he's not improving tomorrow, he will go see a  optometry is or go to urgent care.

## 2011-01-20 NOTE — Patient Instructions (Addendum)
Eye drops as prescribed Cold compress ER , UC or see a optometrist if symptoms get worse, blurred vision, swelling around the eye

## 2011-06-13 ENCOUNTER — Other Ambulatory Visit: Payer: Self-pay | Admitting: Internal Medicine

## 2011-06-13 ENCOUNTER — Ambulatory Visit (INDEPENDENT_AMBULATORY_CARE_PROVIDER_SITE_OTHER): Payer: BC Managed Care – PPO | Admitting: Internal Medicine

## 2011-06-13 VITALS — BP 138/84 | HR 103 | Temp 97.9°F | Resp 18 | Ht 68.0 in | Wt 349.1 lb

## 2011-06-13 DIAGNOSIS — J029 Acute pharyngitis, unspecified: Secondary | ICD-10-CM

## 2011-06-13 DIAGNOSIS — I10 Essential (primary) hypertension: Secondary | ICD-10-CM

## 2011-06-13 LAB — BASIC METABOLIC PANEL
BUN: 17 mg/dL (ref 6–23)
Creatinine, Ser: 1 mg/dL (ref 0.4–1.5)
GFR: 84.84 mL/min (ref >60.00)
Sodium: 144 mEq/L (ref 135–145)

## 2011-06-13 MED ORDER — AMOXICILLIN 500 MG PO CAPS: 1000.0000 mg | ORAL_CAPSULE | Freq: Two times a day (BID) | ORAL | Status: AC

## 2011-06-13 NOTE — Patient Instructions (Signed)
Rest, fluids , tylenol For cough, take Mucinex DM twice a day as needed  Take the antibiotic as prescribed ----> Amoxicillin Call if no better in few days Call anytime if the symptoms are severe, you have high fever Also, you are due for a physical exam, fasting ; please schedule at your earliest convenience

## 2011-06-13 NOTE — Assessment & Plan Note (Signed)
Will RF her meds Labs Due for a CPX , pt aware

## 2011-06-13 NOTE — Progress Notes (Signed)
  Subjective:    Patient ID: Aaron Whitehead, male    DOB: 05/06/1971, 40 y.o.   MRN: [id number]  HPI URI type symptoms on and off for 2 weeks. His daughter  had a documented episodes of strep, wife was  treated empirically as well. He is here today because yesterday he noticed a knot in the neck, mild sore throat and left-sided sinus congestion.  Past Medical History  Diagnosis Date  . Hypertension 2007  . Obesity   . OSA (obstructive sleep apnea)     NPSG 03/09/10 AHI 99.9/hr, CPAP 21/AHI 0.9/hr   Past Surgical History  Procedure Date  . Carpal tunnel release     bilat  . Adenoidectomy      Review of Systems No fever or chills Mild cough Mild postnasal dripping. Mild left ear congestion. No itchy nose or eyes.     Objective:   Physical Exam  Constitutional: He appears well-developed. No distress.  HENT:  Head: Normocephalic and atraumatic.  Right Ear: External ear normal.  Left Ear: External ear normal.       Nose is slightly congested, throat mild to moderately red without discharge. Otherwise throat is normal.  Neck:    Cardiovascular: Normal rate, regular rhythm and normal heart sounds.   No murmur heard. Pulmonary/Chest: Effort normal and breath sounds normal. No respiratory distress. He has no wheezes. He has no rales.  Skin: He is not diaphoretic.          Assessment & Plan:  Likely has pharyngitis in light of exposure to a documented strep case, throat redness and LAD See instructions

## 2011-07-11 ENCOUNTER — Ambulatory Visit (INDEPENDENT_AMBULATORY_CARE_PROVIDER_SITE_OTHER): Payer: BC Managed Care – PPO | Admitting: Internal Medicine

## 2011-07-11 ENCOUNTER — Encounter: Payer: Self-pay | Admitting: Internal Medicine

## 2011-07-11 VITALS — BP 124/80 | HR 97 | Temp 98.4°F | Resp 18 | Wt 349.0 lb

## 2011-07-11 DIAGNOSIS — R220 Localized swelling, mass and lump, head: Secondary | ICD-10-CM

## 2011-07-11 DIAGNOSIS — R221 Localized swelling, mass and lump, neck: Secondary | ICD-10-CM | POA: Insufficient documentation

## 2011-07-11 DIAGNOSIS — J329 Chronic sinusitis, unspecified: Secondary | ICD-10-CM

## 2011-07-11 MED ORDER — FLUTICASONE PROPIONATE 50 MCG/ACT NA SUSP: 2.0000 | Freq: Every day | NASAL | Status: DC

## 2011-07-11 MED ORDER — PREDNISONE 10 MG PO TABS: ORAL_TABLET | ORAL | Status: AC

## 2011-07-11 MED ORDER — MOXIFLOXACIN HCL 400 MG PO TABS: 400.0000 mg | ORAL_TABLET | Freq: Every day | ORAL | Status: DC

## 2011-07-11 NOTE — Progress Notes (Signed)
  Subjective:    Patient ID: Aaron Whitehead, male    DOB: [birth year], 40 y.o.   MRN: [id number]  HPI Was seen 06/13/2011, since then he took antibiotics as prescribed but he continued to have sinus pain and pressure, worse on the left side of his face. He also continued to have a "knot" in the left side of the neck.  Past Medical History  Diagnosis Date  . Hypertension 2007  . Obesity   . OSA (obstructive sleep apnea)     NPSG 03/09/10 AHI 99.9/hr, CPAP 21/AHI 0.9/hr   Past Surgical History  Procedure Date  . Carpal tunnel release     bilat  . Adenoidectomy      Review of Systems No fever or chills Occasionally has itchy nose and eyes Occasionally sneezes Some post nasal dripping that cause cough, mild scratchy throat    Objective:   Physical Exam  Constitutional: He appears well-developed and well-nourished.  HENT:  Head: Normocephalic and atraumatic.  Right Ear: External ear normal.  Left Ear: External ear normal.       Mild discomfort at both maxillary sinuses to palpation. Face symmetric. Nose congested. Throat without redness or discharge.  Neck:       See previous visit note, left sided LAD slightly smaller than before, soft, slightly tender. Does not seem attached to deeper structures  Cardiovascular: Normal rate, regular rhythm and normal heart sounds.   No murmur heard. Pulmonary/Chest: Effort normal and breath sounds normal. No respiratory distress. He has no wheezes. He has no rales.       Assessment & Plan:

## 2011-07-11 NOTE — Assessment & Plan Note (Addendum)
See last ov note, today the L sided neck structure (likely LAD) is slt smaller than 1.5 cm We discussed ENT referral  v. Observe ; he lected observation but will call if not back to normal after treatment  See instructions

## 2011-07-11 NOTE — Patient Instructions (Signed)
For mucus and congestion take Mucinex DM twice a day as needed  Take meds as prescribed Nasal sprays can be taken by months Call if no better in few days Call anytime if the symptoms are severe Call if the neck "knot" not improving or if resurface after treatment

## 2011-07-11 NOTE — Assessment & Plan Note (Signed)
Persistent sx x weeks, allergic v. Infectious sinusitis Plan: See instructions

## 2011-07-18 ENCOUNTER — Encounter: Payer: Self-pay | Admitting: Internal Medicine

## 2011-07-18 ENCOUNTER — Ambulatory Visit (INDEPENDENT_AMBULATORY_CARE_PROVIDER_SITE_OTHER): Payer: BC Managed Care – PPO | Admitting: Internal Medicine

## 2011-07-18 DIAGNOSIS — Z2089 Contact with and (suspected) exposure to other communicable diseases: Secondary | ICD-10-CM

## 2011-07-18 DIAGNOSIS — I10 Essential (primary) hypertension: Secondary | ICD-10-CM

## 2011-07-18 DIAGNOSIS — Z20818 Contact with and (suspected) exposure to other bacterial communicable diseases: Secondary | ICD-10-CM

## 2011-07-18 DIAGNOSIS — Z20828 Contact with and (suspected) exposure to other viral communicable diseases: Secondary | ICD-10-CM

## 2011-07-18 DIAGNOSIS — J069 Acute upper respiratory infection, unspecified: Secondary | ICD-10-CM

## 2011-07-18 DIAGNOSIS — R05 Cough: Secondary | ICD-10-CM

## 2011-07-18 LAB — POCT RAPID STREP A (OFFICE): Rapid Strep A Screen: NEGATIVE

## 2011-07-18 LAB — CBC WITH DIFFERENTIAL/PLATELET
Basophils Absolute: 0.1 10*3/uL (ref 0.0–0.1)
Basophils Relative: 0.8 % (ref 0.0–3.0)
Eosinophils Absolute: 0.1 10*3/uL (ref 0.0–0.7)
Eosinophils Relative: 0.7 % (ref 0.0–5.0)
Lymphs Abs: 3.1 10*3/uL (ref 0.7–4.0)
MCV: 89.3 fl (ref 78.0–100.0)
Monocytes Absolute: 0.8 10*3/uL (ref 0.1–1.0)
Monocytes Relative: 5.4 % (ref 3.0–12.0)
Neutro Abs: 10.7 10*3/uL — ABNORMAL HIGH (ref 1.4–7.7)
RBC: 5.11 Mil/uL (ref 4.22–5.81)
WBC: 14.8 10*3/uL — ABNORMAL HIGH (ref 4.5–10.5)

## 2011-07-18 LAB — POCT INFLUENZA A/B: Influenza A, POC: NEGATIVE

## 2011-07-18 MED ORDER — OSELTAMIVIR PHOSPHATE 75 MG PO CAPS: 75.0000 mg | ORAL_CAPSULE | Freq: Every day | ORAL | Status: AC

## 2011-07-18 MED ORDER — LOSARTAN POTASSIUM 50 MG PO TABS: 50.0000 mg | ORAL_TABLET | Freq: Every day | ORAL | Status: DC

## 2011-07-18 NOTE — Progress Notes (Signed)
  Subjective:    Patient ID: Aaron Whitehead, male    DOB: 02/24/1971, 40 y.o.   MRN: [id number]  HPI SINUS SYMPTOMS: Onset/symptoms:onset 2 mos ago as "cold" which resolved incompletely Exposures (illness/environmental/extrinsic):daughter had strep Progression of symptoms:to L frontal & facial pressure & LA in L cervical chain Treatments/response:Amox originally but recurrence of above; Avelox  & steroids Rxed last week Present symptoms: Fever/chills/sweats:sweats & chills Frontal headache:yes Facial pain:yes Nasal purulence:no Sore throat:minor Dental pain:L max & mandibular teeth Lymphadenopathy:persistent Wheezing/shortness of breath:SOB  With minor exertion Cough/sputum/hemoptysis:tickling , dry cough X 3 days. Note: on ACE-I Pleuritic pain:discomfort L > R inferior rib cage  Associated extrinsic/allergic symptoms:itchy eyes/ sneezing:better with Flonase Past medical history: Seasonal allergies:previously /asthma:as child Smoking history:quit @ age 89.  Another daughter was diagnosed with strep today. 2 daughters have flu. He has not taken the flu shot as he feels it has made him ill in the past.           Review of Systems     Objective:   Physical Exam General appearance is of good health and nourishment; no acute distress or increased work of breathing is present.  No  lymphadenopathy about the head, neck, or axilla noted.   Eyes: No conjunctival inflammation or lid edema is present.EOMI & vision intact   Ears:  External ear exam shows no significant lesions or deformities.  Otoscopic examination reveals clear canals, tympanic membranes are intact bilaterally without bulging, retraction, inflammation or discharge. Minor vascular accentuation L TM  Nose:  External nasal examination shows no deformity .Significant  Inflammation with edema L nare.. No septal dislocation .L nare obstruction to airflow.   Oral exam: Dental hygiene is good; lips and gums are healthy  appearing.There is mild  oropharyngeal erythema ; w/o  exudate   Neck:  No deformities, thyromegaly, masses, or tenderness noted.   Supple with full range of motion without pain.   Heart:  Normal rate and regular rhythm. S1 and S2 normal without gallop, murmur, click, rub S 4 Lungs:Chest clear to auscultation; no wheezes, rhonchi,rales ,or rubs present.No increased work of breathing.    Extremities:  No cyanosis  or clubbing  noted . Trace edema @ sock line   Skin: Warm & dry w/o jaundice or tenting.          Assessment & Plan:  #1 left frontal sinus and maxillary sinus pressure without purulent discharge. Status post courses of amoxicillin and Avelox.  #2 cough, nonproductive in the context of ACE inhibitor therapy.  #3 exposure to flu and strep. He has not been immunized against flu.  Plan, discussion: The Avelox should have  treated resistant organisms which amoxicillin may not. Sinus CT should be performed along with CBC and differential, rapid strep, and nasal swab for influenza A and B. The risk of not taking flu shots was discussed. He will be placed on Tamiflu. Change ACE-I to Losartan

## 2011-07-18 NOTE — Patient Instructions (Signed)
Plain Mucinex for thick secretions ;force NON dairy fluids . Use a Neti pot daily as needed for sinus congestion 1 spray in each nostril twice a day as needed. Use the "crossover" technique as discussed twice a day for the [facility]

## 2011-07-19 ENCOUNTER — Ambulatory Visit (HOSPITAL_BASED_OUTPATIENT_CLINIC_OR_DEPARTMENT_OTHER)
Admission: RE | Admit: 2011-07-19 | Discharge: 2011-07-19 | Disposition: A | Discharge status: Home / Self Care | Payer: BC Managed Care – PPO | Source: Ambulatory Visit | Attending: Internal Medicine | Admitting: Internal Medicine

## 2011-07-19 DIAGNOSIS — J342 Deviated nasal septum: Secondary | ICD-10-CM | POA: Insufficient documentation

## 2011-07-19 DIAGNOSIS — J069 Acute upper respiratory infection, unspecified: Secondary | ICD-10-CM

## 2011-07-19 DIAGNOSIS — R51 Headache: Secondary | ICD-10-CM | POA: Insufficient documentation

## 2011-07-20 ENCOUNTER — Telehealth: Payer: Self-pay

## 2011-07-20 NOTE — Telephone Encounter (Signed)
Message copied by Maurice Small on Wed Jul 20, 2011  1:39 PM ------      Message from: Pecola Lawless      Created: Tue Jul 19, 2011  5:38 PM       The CAT scan does not suggest any active sinusitis. Please  report any fever or purulent (yellow or green secretions from nose). Use the nasal steroids twice a day employing  the crossover technique as discussed.If pressure symptoms  persists or progresses , I recommend ENT referral.

## 2011-07-20 NOTE — Telephone Encounter (Signed)
Patient aware of CT and lab results, patient states he is a little better.

## 2011-08-22 ENCOUNTER — Ambulatory Visit (INDEPENDENT_AMBULATORY_CARE_PROVIDER_SITE_OTHER): Payer: BC Managed Care – PPO | Admitting: Internal Medicine

## 2011-08-22 VITALS — BP 110/76 | HR 92 | Temp 98.8°F | Ht 69.0 in | Wt 348.0 lb

## 2011-08-22 DIAGNOSIS — J069 Acute upper respiratory infection, unspecified: Secondary | ICD-10-CM

## 2011-08-22 DIAGNOSIS — R221 Localized swelling, mass and lump, neck: Secondary | ICD-10-CM

## 2011-08-22 DIAGNOSIS — J351 Hypertrophy of tonsils: Secondary | ICD-10-CM

## 2011-08-22 NOTE — Patient Instructions (Signed)
Rest, fluids , tylenol For cough, take Mucinex DM twice a day as needed  Call if no better in few days Call anytime if the symptoms are severe  

## 2011-08-22 NOTE — Progress Notes (Signed)
  Subjective:    Patient ID: Aaron Whitehead, male    DOB: 01/23/1971, 41 y.o.   MRN: [id number]  HPI Acute visit Developed a cold last week: Slightly achy, runny nose, mild sore throat. Yesterday he also developed some achiness at the sinus area bilaterally.   Past Medical History: Hypertension (2007) obesity OSA (obstructive sleep apnea) NPSG 03/09/10 AHI 99.9/hr, CPAP 21/AHI 0.9/hr   Past Surgical History: Carpal tunnel release , bilateral adenoidectomy  Family History: Reviewed history from 09/10/2007 and no changes required. DM - GM MI - GF prostate Ca - no colon ca - no HTN - M CAD - F had cardiomegaly +smoker  Social History: Divorced, remarried  2 kids Never Smoked Alcohol use- socially   Review of Systems No fevers, no chills. Mild nausea no vomiting. No diarrhea My myalgias.     Objective:   Physical Exam  Constitutional: He appears well-developed. No distress.  HENT:  Head: Normocephalic and atraumatic.  Right Ear: External ear normal.  Left Ear: External ear normal.       Face is symmetric, minimal tenderness to palpation of both maxillary sinuses. Nose is slightly congested. Throat without redness, uvula midline. Right tonsil  normal-appearing, larger than the left.  Neck:       previously noted left neck lymphadenopathy  not palpated today  Cardiovascular: Normal rate, regular rhythm, normal heart sounds and intact distal pulses.   No murmur heard. Pulmonary/Chest: Effort normal and breath sounds normal. No respiratory distress. He has no wheezes. He has no rales.  Skin: He is not diaphoretic.       Assessment & Plan:  URI: Doubt acute bacterial sinusitis, conservative treatment. See instructions Enlarged right tonsil a new finding, referral to ENT

## 2011-08-23 ENCOUNTER — Encounter: Payer: Self-pay | Admitting: Internal Medicine

## 2011-08-23 NOTE — Assessment & Plan Note (Signed)
Resolved on exam today. 

## 2011-09-06 ENCOUNTER — Other Ambulatory Visit (HOSPITAL_COMMUNITY): Payer: Self-pay | Admitting: Otolaryngology

## 2011-09-23 ENCOUNTER — Inpatient Hospital Stay (HOSPITAL_COMMUNITY): Admission: RE | Admit: 2011-09-23 | Payer: BC Managed Care – PPO | Source: Ambulatory Visit

## 2011-09-30 ENCOUNTER — Ambulatory Visit (HOSPITAL_COMMUNITY): Admission: RE | Admit: 2011-09-30 | Payer: BC Managed Care – PPO | Source: Ambulatory Visit | Admitting: Otolaryngology

## 2011-09-30 ENCOUNTER — Encounter (HOSPITAL_COMMUNITY): Admission: RE | Payer: Self-pay | Source: Ambulatory Visit

## 2011-09-30 SURGERY — TONSILLECTOMY
Anesthesia: General | Laterality: Bilateral

## 2011-10-25 ENCOUNTER — Encounter: Payer: Self-pay | Admitting: Internal Medicine

## 2011-10-25 ENCOUNTER — Ambulatory Visit (INDEPENDENT_AMBULATORY_CARE_PROVIDER_SITE_OTHER): Payer: BC Managed Care – PPO | Admitting: Internal Medicine

## 2011-10-25 VITALS — BP 110/78 | HR 82 | Ht 68.0 in | Wt 328.2 lb

## 2011-10-25 DIAGNOSIS — E669 Obesity, unspecified: Secondary | ICD-10-CM

## 2011-10-25 DIAGNOSIS — G4733 Obstructive sleep apnea (adult) (pediatric): Secondary | ICD-10-CM

## 2011-10-25 NOTE — Progress Notes (Signed)
10/25/11- 79 yoM former smoker followed for OSA complicated by HBP, obesity, rhinitis LOV-10/25/10 He remains very pleased with his BiPAP set at 21/21 from [facility]. Describes frequent sinus infections over the past year. Working with an Producer, television/film/video and throat physician in [place] and improved. Tonsils became enlarged and the right one stayed enlarged so he is pending tonsillectomy. Denies recent headache, purulent discharge, sore throat, cough or wheeze. Sleeps well.  ROS-see HPI Constitutional:   No-   weight loss, night sweats, fevers, chills, fatigue, lassitude. HEENT:   No-  headaches, difficulty swallowing, tooth/dental problems, sore throat,       No-  sneezing, itching, ear ache, nasal congestion, post nasal drip,  CV:  No-   chest pain, orthopnea, PND, swelling in lower extremities, anasarca,  dizziness, palpitations Resp: No-   shortness of breath with exertion or at rest.              No-   productive cough,  No non-productive cough,  No- coughing up of blood.              No-   change in color of mucus.  No- wheezing.   Skin: No-   rash or lesions. GI:  No-   heartburn, indigestion, abdominal pain, nausea, vomiting,  GU:  MS:  No-   joint pain or swelling.   Neuro-     nothing unusual Psych:  No- change in mood or affect. No depression or anxiety.  No memory loss.  OBJ- Physical Exam General- Alert, Oriented, Affect-appropriate, Distress- none acute Skin- rash-none, lesions- none, excoriation- none Lymphadenopathy- none Head- atraumatic            Eyes- Gross vision intact, PERRLA, conjunctivae and secretions clear            Ears- Hearing, canals-normal            Nose- Clear, no-Septal dev, mucus, polyps, erosion, perforation             Throat- Mallampati II , mucosa clear , drainage- none, tonsils- prominent R Neck- flexible , trachea midline, no stridor , thyroid nl, carotid no bruit Chest - symmetrical excursion , unlabored           Heart/CV- RRR , no murmur , no  gallop  , no rub, nl s1 s2                           - JVD- none , edema- none, stasis changes- none, varices- none           Lung- clear to P&A, wheeze- none, cough- none , dullness-none, rub- none           Chest wall-  Abd-  Br/ Gen/ Rectal- Not done, not indicated Extrem- cyanosis- none, clubbing, none, atrophy- none, strength- nl Neuro- grossly intact to observation

## 2011-10-25 NOTE — Patient Instructions (Signed)
Currently your BiPAP is set at a fixed, continuous pressure of 21 cwp.  Please call as needed

## 2011-10-27 NOTE — Assessment & Plan Note (Signed)
He requires a bilevel machine because of the high pressure but sleeps well with that said both inspiration and expiration at 21. He is very pleased. Compliance and control her excellent. Weight loss would help. We discussed use of CPAP/BiPAP at time of surgery and sedation.

## 2011-10-27 NOTE — Assessment & Plan Note (Signed)
Advised weight loss. Bariatric center may be able to offer support.

## 2011-11-04 ENCOUNTER — Other Ambulatory Visit: Payer: Self-pay | Admitting: Internal Medicine

## 2011-11-07 NOTE — Telephone Encounter (Signed)
Refill done.  

## 2012-01-19 ENCOUNTER — Other Ambulatory Visit: Payer: Self-pay | Admitting: Internal Medicine

## 2012-01-20 NOTE — Telephone Encounter (Signed)
Refill done.  

## 2012-02-27 ENCOUNTER — Telehealth: Payer: Self-pay | Admitting: Internal Medicine

## 2012-02-27 DIAGNOSIS — R05 Cough: Secondary | ICD-10-CM

## 2012-02-27 DIAGNOSIS — I10 Essential (primary) hypertension: Secondary | ICD-10-CM

## 2012-02-27 MED ORDER — LOSARTAN POTASSIUM 50 MG PO TABS: 50.0000 mg | ORAL_TABLET | Freq: Every day | ORAL | Status: DC

## 2012-02-27 NOTE — Telephone Encounter (Signed)
Refill done.  

## 2012-02-27 NOTE — Telephone Encounter (Signed)
Refill: Losartan potassium 50mg  tab. Take 1 tablet by mouth daily. Qty 30. Last fill 01-16-12

## 2012-07-16 ENCOUNTER — Telehealth: Payer: Self-pay | Admitting: Internal Medicine

## 2012-07-16 ENCOUNTER — Ambulatory Visit (INDEPENDENT_AMBULATORY_CARE_PROVIDER_SITE_OTHER): Payer: BC Managed Care – PPO | Admitting: Family Medicine

## 2012-07-16 ENCOUNTER — Encounter: Payer: Self-pay | Admitting: Family Medicine

## 2012-07-16 VITALS — BP 120/70 | HR 96 | Temp 98.1°F | Wt 347.4 lb

## 2012-07-16 DIAGNOSIS — J40 Bronchitis, not specified as acute or chronic: Secondary | ICD-10-CM

## 2012-07-16 MED ORDER — METHYLPREDNISOLONE ACETATE 80 MG/ML IJ SUSP
80.0000 mg | Freq: Once | INTRAMUSCULAR | Status: AC
  Administered 2012-07-16: 80 mg via INTRAMUSCULAR

## 2012-07-16 MED ORDER — ALBUTEROL SULFATE (2.5 MG/3ML) 0.083% IN NEBU
2.5000 mg | INHALATION_SOLUTION | Freq: Once | RESPIRATORY_TRACT | Status: AC
  Administered 2012-07-16: 2.5 mg via RESPIRATORY_TRACT

## 2012-07-16 MED ORDER — PREDNISONE 10 MG PO TABS: ORAL_TABLET | ORAL | Status: DC

## 2012-07-16 MED ORDER — BECLOMETHASONE DIPROPIONATE 40 MCG/ACT IN AERS: 2.0000 | INHALATION_SPRAY | Freq: Two times a day (BID) | RESPIRATORY_TRACT | Status: DC

## 2012-07-16 MED ORDER — ALBUTEROL SULFATE HFA 108 (90 BASE) MCG/ACT IN AERS: 2.0000 | INHALATION_SPRAY | Freq: Four times a day (QID) | RESPIRATORY_TRACT | Status: DC | PRN

## 2012-07-16 NOTE — Progress Notes (Signed)
  Subjective:     Aaron Whitehead is a 41 y.o. male here for evaluation of a cough. Onset of symptoms was 1 week ago. Symptoms have been gradually worsening since that time. The cough is barky and dry and is aggravated by cold air, dust, exercise and pollens. Associated symptoms include: shortness of breath and wheezing. Patient does have a history of asthma. Patient does have a history of environmental allergens. Patient has not traveled recently. Patient does not have a history of smoking. Patient has not had a previous chest x-ray. Patient has not had a PPD done.  The following portions of the patient's history were reviewed and updated as appropriate: allergies, current medications, past family history, past medical history, past social history, past surgical history and problem list.  Review of Systems Pertinent items are noted in HPI. 2   Objective:    Oxygen saturation 96% on room air BP 120/70  Pulse 96  Temp 98.1 F (36.7 C) (Oral)  Wt 347 lb 6.4 oz (157.58 kg)  SpO2 96% General appearance: alert, cooperative, appears stated age and no distress Nose: Nares normal. Septum midline. Mucosa normal. No drainage or sinus tenderness. Throat: lips, mucosa, and tongue normal; teeth and gums normal Neck: no adenopathy, supple, symmetrical, trachea midline and thyroid not enlarged, symmetric, no tenderness/mass/nodules Lungs: diminished breath sounds bilaterally and wheezes bilaterally Heart: S1, S2 normal    Assessment:    Acute Bronchitis    Plan:    Explained lack of efficacy of antibiotics in viral disease. Antitussives per medication orders. Avoid exposure to tobacco smoke and fumes. B-agonist inhaler. Call if shortness of breath worsens, blood in sputum, change in character of cough, development of fever or chills, inability to maintain nutrition and hydration. Avoid exposure to tobacco smoke and fumes. Steroid inhaler as ordered. depo medrol and pred taper

## 2012-07-16 NOTE — Telephone Encounter (Signed)
Attempted to call patient regarding cough/congestion. No answer, left message to call office back.

## 2012-07-16 NOTE — Addendum Note (Signed)
Addended by: Arnette Norris on: 07/16/2012 11:26 AM   Modules accepted: Orders

## 2012-07-16 NOTE — Telephone Encounter (Signed)
Left message to call office

## 2012-07-16 NOTE — Patient Instructions (Signed)

## 2012-07-16 NOTE — Telephone Encounter (Signed)
Pt c/o cold symptoms with chest congestion, sob and dry cough. Pt coming in today.

## 2012-07-30 ENCOUNTER — Encounter: Payer: Self-pay | Admitting: *Deleted

## 2012-07-30 ENCOUNTER — Other Ambulatory Visit: Payer: Self-pay | Admitting: Internal Medicine

## 2012-07-30 NOTE — Telephone Encounter (Signed)
Refill done.  Letter mailed to pt to advise him to call the office to schedule a CPE .

## 2012-10-02 ENCOUNTER — Telehealth: Payer: Self-pay | Admitting: Internal Medicine

## 2012-10-02 MED ORDER — FELODIPINE ER 10 MG PO TB24: ORAL_TABLET | ORAL | Status: DC

## 2012-10-02 NOTE — Telephone Encounter (Signed)
Discussed with pt, schedule CPE 3.20.14. Refill done.

## 2012-10-02 NOTE — Telephone Encounter (Signed)
Call pt , schedule a f/u within a month. Call #[phone], no RF

## 2012-10-02 NOTE — Telephone Encounter (Signed)
Pt has not had a ROV in over a year. Pt was made aware 12.23.13 that he was due for an OV. Pt has no future appts. OK to refill?

## 2012-10-02 NOTE — Telephone Encounter (Signed)
refill  Felodipine ER 10 MG take ONE TABLET BY MOUTH DAILY  #30 last fill 12.23.13

## 2012-10-03 ENCOUNTER — Encounter (INDEPENDENT_AMBULATORY_CARE_PROVIDER_SITE_OTHER): Payer: Self-pay | Admitting: General Surgery

## 2012-10-03 ENCOUNTER — Ambulatory Visit (INDEPENDENT_AMBULATORY_CARE_PROVIDER_SITE_OTHER): Payer: BC Managed Care – PPO | Admitting: General Surgery

## 2012-10-03 DIAGNOSIS — J45909 Unspecified asthma, uncomplicated: Secondary | ICD-10-CM

## 2012-10-03 DIAGNOSIS — Z6841 Body Mass Index (BMI) 40.0 and over, adult: Secondary | ICD-10-CM

## 2012-10-03 DIAGNOSIS — I10 Essential (primary) hypertension: Secondary | ICD-10-CM

## 2012-10-03 NOTE — Progress Notes (Signed)
Patient ID: Aaron Whitehead, male   DOB: Oct 02, 1970, 42 y.o.   MRN: [id number]  Chief Complaint  Patient presents with  . Bariatric Pre-op    Initial    HPI Aaron Whitehead is a 42 y.o. male.  This patient presents for his initial weight loss surgery evaluation. He has a tender information session and is interested in a sleeve gastrectomy. He has a BMI of 54.8 with obesity related comorbidities of obstructive sleep apnea which requires CPAP, hypertension, and asthma. He has trouble with his weight "all my life". He was into mount biking for a short period and he was able to maintain his weight at that time but he has recently joined a gym and  trying to walk more frequently but really has been unable to lose the weight.  He has tried several diets with the most successful being the isogenic diet which she has tried a couple times. He lost 40 pounds one time and 30 pounds another time but again has been unable to maintain weight loss. He denies any history of heartburn or reflux. HPI  Past Medical History  Diagnosis Date  . Hypertension 2007  . Obesity   . OSA (obstructive sleep apnea)     NPSG 03/09/10 AHI 99.9/hr, CPAP 21/AHI 0.9/hr    Past Surgical History  Procedure Laterality Date  . Carpal tunnel release      bilat  . Adenoidectomy      Family History  Problem Relation Age of Onset  . Diabetes      GM  . Heart attack      GF  . Prostate cancer Neg Hx   . Colon cancer Neg Hx   . Hypertension Mother   . Coronary artery disease Father     had cardiomegaly +smoker     Social History History  Substance Use Topics  . Smoking status: Former Games developer  . Smokeless tobacco: Never Used  . Alcohol Use: Yes    No Known Allergies  Current Outpatient Prescriptions  Medication Sig Dispense Refill  . albuterol (PROAIR HFA) 108 (90 BASE) MCG/ACT inhaler Inhale 2 puffs into the lungs every 6 (six) hours as needed for wheezing.      . beclomethasone (QVAR) 40 MCG/ACT inhaler Inhale 2  puffs into the lungs 2 (two) times daily.  1 Inhaler  12  . felodipine (PLENDIL) 10 MG 24 hr tablet take ONE TABLET BY MOUTH DAILY -  30 tablet  0  . fluticasone (FLONASE) 50 MCG/ACT nasal spray PLACE 2 SPRAYS INTO THE NOSE DAILY.  16 g  2  . losartan (COZAAR) 50 MG tablet Take 1 tablet (50 mg total) by mouth daily.  30 tablet  5  . Multiple Vitamin (MULTIVITAMIN) tablet Take 1 tablet by mouth daily.      . predniSONE (DELTASONE) 10 MG tablet 3 po qd for 3 days then 2 po qd for 3 days the 1 po qd for 3 days  18 tablet  0   No current facility-administered medications for this visit.    Review of Systems Review of Systems All other review of systems negative or noncontributory except as stated in the HPI  Blood pressure 146/64, pulse 88, temperature 97.4 F (36.3 C), temperature source Temporal, resp. rate 16, height 5\' 7"  (1.702 m), weight 350 lb 3.2 oz (158.85 kg).  Physical Exam Physical Exam Physical Exam  Vitals reviewed. Constitutional: He is oriented to person, place, and time. He appears well-developed and well-nourished. No distress.  HENT:  Head: Normocephalic and atraumatic.  Mouth/Throat: No oropharyngeal exudate.  Eyes: Conjunctivae and EOM are normal. Pupils are equal, round, and reactive to light. Right eye exhibits no discharge. Left eye exhibits no discharge. No scleral icterus.  Neck: Normal range of motion. No tracheal deviation present.  Cardiovascular: Normal rate, regular rhythm and normal heart sounds.   Pulmonary/Chest: Effort normal and breath sounds normal. No stridor. No respiratory distress. He has no wheezes. He has no rales. He exhibits no tenderness.  Abdominal: Soft. Bowel sounds are normal. He exhibits no distension and no mass. There is no tenderness. There is no rebound and no guarding.  Musculoskeletal: Normal range of motion. He exhibits no edema and no tenderness.  Neurological: He is alert and oriented to person, place, and time.  Skin: Skin is  warm and dry. No rash noted. He is not diaphoretic. No erythema. No pallor.  Psychiatric: He has a normal mood and affect. His behavior is normal. Judgment and thought content normal.    Data Reviewed   Assessment    Morbid obesity with a BMI of 54.8 and obesity related comorbidities of obstructive sleep apnea, hypertension, and asthma. We had a long discussion regarding all of the nonsurgical and surgical weight loss options including the lap band, sleeve gastrectomy, and Roux-en-Y gastric bypass. He remains most interested in the vertical sleeve gastrectomy. We discussed the pros and cons of each procedure and the risks of the procedure. The risks of infection, bleeding, pain, scarring, weight regain, too little or too much weight loss, vitamin deficiencies and need for lifelong vitamin supplementation, hair loss, need for protein supplementation, leaks, stricture, reflux, food intolerance, need for reoperation and conversion to roux Y gastric bypass, need for open surgery, injury to spleen or surrounding structures, DVT's, PE, and death again discussed with the patient and the patient expressed understanding and desires to proceed with laparoscopic vertical sleeve gastrectomy, possible open, intraoperative endoscopy.     Plan    We will go ahead and set him up for his nutrition labs, psychology and nutrition evaluation and UGI and we will see him back after these are complete.        Lodema Pilot DAVID 10/03/2012, 4:13 PM

## 2012-10-18 ENCOUNTER — Ambulatory Visit (HOSPITAL_COMMUNITY): Payer: BC Managed Care – PPO

## 2012-10-19 ENCOUNTER — Encounter: Payer: Self-pay | Admitting: *Deleted

## 2012-10-19 ENCOUNTER — Encounter: Payer: BC Managed Care – PPO | Attending: General Surgery | Admitting: *Deleted

## 2012-10-19 VITALS — Ht 67.0 in | Wt 356.4 lb

## 2012-10-19 DIAGNOSIS — E669 Obesity, unspecified: Secondary | ICD-10-CM | POA: Insufficient documentation

## 2012-10-19 DIAGNOSIS — Z713 Dietary counseling and surveillance: Secondary | ICD-10-CM | POA: Insufficient documentation

## 2012-10-19 LAB — LIPID PANEL
Cholesterol: 199 mg/dL (ref 0–200)
HDL: 38 mg/dL — ABNORMAL LOW (ref >39)
LDL Cholesterol: 136 mg/dL — ABNORMAL HIGH (ref 0–99)
Total CHOL/HDL Ratio: 5.2 Ratio
Triglycerides: 126 mg/dL (ref <150)

## 2012-10-19 LAB — COMPREHENSIVE METABOLIC PANEL
Alkaline Phosphatase: 55 U/L (ref 39–117)
CO2: 28 mEq/L (ref 19–32)
Creat: 0.83 mg/dL (ref 0.50–1.35)
Glucose, Bld: 83 mg/dL (ref 70–99)
Potassium: 4.2 mEq/L (ref 3.5–5.3)
Sodium: 139 mEq/L (ref 135–145)
Total Bilirubin: 0.6 mg/dL (ref 0.3–1.2)

## 2012-10-19 LAB — CBC WITH DIFFERENTIAL/PLATELET
Basophils Absolute: 0 10*3/uL (ref 0.0–0.1)
Basophils Relative: 0 % (ref 0–1)
Eosinophils Relative: 1 % (ref 0–5)
MCHC: 34.9 g/dL (ref 30.0–36.0)
Monocytes Absolute: 0.6 10*3/uL (ref 0.1–1.0)
Neutro Abs: 5.5 10*3/uL (ref 1.7–7.7)
Neutrophils Relative %: 68 % (ref 43–77)
RBC: 5.07 MIL/uL (ref 4.22–5.81)
RDW: 14 % (ref 11.5–15.5)

## 2012-10-19 LAB — T4: T4, Total: 6.7 ug/dL (ref 5.0–12.5)

## 2012-10-19 NOTE — Patient Instructions (Addendum)
   Follow Pre-Op Nutrition Goals to prepare for Gastric Sleeve Surgery.   Call the [facility] at [phone] once you have been given your surgery date to enrolled in the Pre-Op Nutrition Class. You will need to attend this nutrition class 3-4 weeks prior to your surgery.  

## 2012-10-22 LAB — H. PYLORI ANTIBODY, IGG: H Pylori IgG: 0.52 {ISR}

## 2012-10-23 NOTE — Progress Notes (Signed)
  Pre-Op Assessment Visit:  Pre-Operative Gastric Sleeve Surgery/Supervised Weight Loss Visit  Medical Nutrition Therapy:  Appt start time: 0800   End time:  0900.  Patient was seen on 10/19/12 for Pre-Operative Gastric Sleeve Nutrition Assessment. Assessment and letter of approval faxed to [facility] Surgery Bariatric Surgery Program coordinator on 10/23/12.  Approval letter sent to [facility] center and will be available in the chart under the media tab.  TANITA  BODY COMP RESULTS  10/19/12   BMI (kg/m^2) 55.5   Fat Mass (lbs) 131.5   Fat Free Mass (lbs) 223.0   Total Body Water (lbs) 163.0   Handouts given during visit include:  Pre-Op Goals   Bariatric Surgery Protein Shakes  Samples given during visit include:   Premier Protein shake Lot: [id number]; Exp: 06/15/13 - 1 bottle Lot: [id number]; Exp: 08/20/13 - 1 bottle  Patient to call for Pre-Op and Post-Op Nutrition Education at the [facility] when surgery is scheduled.

## 2012-10-25 ENCOUNTER — Other Ambulatory Visit: Payer: Self-pay

## 2012-10-25 ENCOUNTER — Encounter: Payer: BC Managed Care – PPO | Admitting: Internal Medicine

## 2012-10-25 ENCOUNTER — Ambulatory Visit: Payer: BC Managed Care – PPO | Admitting: Internal Medicine

## 2012-10-25 ENCOUNTER — Ambulatory Visit (HOSPITAL_COMMUNITY)
Admission: RE | Admit: 2012-10-25 | Discharge: 2012-10-25 | Disposition: A | Discharge status: Home / Self Care | Payer: BC Managed Care – PPO | Source: Ambulatory Visit | Attending: General Surgery | Admitting: General Surgery

## 2012-10-25 DIAGNOSIS — I10 Essential (primary) hypertension: Secondary | ICD-10-CM | POA: Insufficient documentation

## 2012-10-25 DIAGNOSIS — G4730 Sleep apnea, unspecified: Secondary | ICD-10-CM | POA: Insufficient documentation

## 2012-10-25 DIAGNOSIS — J45909 Unspecified asthma, uncomplicated: Secondary | ICD-10-CM

## 2012-10-25 DIAGNOSIS — Z6841 Body Mass Index (BMI) 40.0 and over, adult: Secondary | ICD-10-CM | POA: Insufficient documentation

## 2012-11-07 ENCOUNTER — Encounter: Payer: Self-pay | Admitting: Internal Medicine

## 2012-11-07 ENCOUNTER — Ambulatory Visit (INDEPENDENT_AMBULATORY_CARE_PROVIDER_SITE_OTHER): Payer: BC Managed Care – PPO | Admitting: Internal Medicine

## 2012-11-07 VITALS — BP 124/76 | HR 99 | Temp 97.8°F | Wt 356.0 lb

## 2012-11-07 DIAGNOSIS — I10 Essential (primary) hypertension: Secondary | ICD-10-CM

## 2012-11-07 DIAGNOSIS — R202 Paresthesia of skin: Secondary | ICD-10-CM | POA: Insufficient documentation

## 2012-11-07 DIAGNOSIS — J45909 Unspecified asthma, uncomplicated: Secondary | ICD-10-CM

## 2012-11-07 DIAGNOSIS — R209 Unspecified disturbances of skin sensation: Secondary | ICD-10-CM

## 2012-11-07 HISTORY — DX: Unspecified asthma, uncomplicated: J45.909

## 2012-11-07 LAB — BASIC METABOLIC PANEL
Calcium: 9 mg/dL (ref 8.4–10.5)
Chloride: 100 mEq/L (ref 96–112)
Creatinine, Ser: 0.9 mg/dL (ref 0.4–1.5)

## 2012-11-07 MED ORDER — LIDOCAINE 5 % EX PTCH: 1.0000 | MEDICATED_PATCH | CUTANEOUS | Status: DC

## 2012-11-07 NOTE — Assessment & Plan Note (Addendum)
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 Multiple questions about this condition discussed. Plan: Wt loss advil lidoderm patch If no better will need ortho eval

## 2012-11-07 NOTE — Assessment & Plan Note (Addendum)
BP well-controlled, good compliance w/ medications. Plan:  Refill medicines, check a BMP. Planning to come back for a physical at some point in the near future --- Also the patient is concerned about edema, in the setting of a BMI of 55, edema is likely related to obesity, maybe also related with calcium channel blockers.  I recommended to reassess this problem once he lost some weight, consider a diuretic or change CCBs.

## 2012-11-07 NOTE — Progress Notes (Signed)
  Subjective:    Patient ID: Aaron Whitehead, male    DOB: 1970-09-08, 42 y.o.   MRN: [id number]  HPI Acute visit One month history of numbness at the side of the left tight, worse at night, getting worse for the last 2 weeks. Walking and changing positions does not affect pain; at  night sometimes he has severe pain in the area described as a "hot knife". Previously had similar symptoms that somehow decreased after he lost weight.  Hypertension, good medication compliance, no recent labs.  Past Medical History  Diagnosis Date  . Hypertension 2007  . Obesity   . OSA (obstructive sleep apnea)     NPSG 03/09/10 AHI 99.9/hr, CPAP 21/AHI 0.9/hr  . Unspecified asthma 11/07/2012   Past Surgical History  Procedure Laterality Date  . Carpal tunnel release      bilat  . Adenoidectomy    . Tonsillectomy      History   Social History  . Marital Status: Married    Spouse Name: N/A    Number of Children: 4  . Years of Education: N/A   Occupational History  . 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 Smoking status: Former Games developer  . Smokeless tobacco: Never Used  . Alcohol Use: Yes  . Drug Use: Not on file  . Sexually Active: Not on file   Other Topics Concern  . Not on file   Social History Narrative   Divorced, remarried             Review of Systems Denies any bladder or bowel incontinence, no toes paresthesias. Not back pain per se, mild tightness if anything. Not rash at the back or legs.    Objective:   Physical Exam  Skin:      BP 124/76  Pulse 99  Temp(Src) 97.8 F (36.6 C) (Oral)  Wt 356 lb (161.481 kg)  BMI 55.74 kg/m2  SpO2 97%  General -- alert, well-developed, BMI 55.    Lungs -- normal respiratory effort, no intercostal retractions, no accessory muscle use, and normal breath sounds.   Heart-- normal rate, regular rhythm, no murmur, and no gallop.   Abdomen--soft, non-tender, no distention   Extremities-- +/+++  pretibial edema bilaterally; Skin normal. Rotation of hips normal B. Question of trochanteric bursa discomfort on the L Back: Nontender to palpation, nor rash Neurologic--  alert & oriented X3. Pinprick examination showed that the area of concern has decrease sensitivity. DTRs in the lower extremities symmetric except for a decreased left knee jerk. Motor exam normal, gait normal.  Psych-- Cognition and judgment appear intact. Alert and cooperative with normal attention span and concentration.  not anxious appearing and not depressed appearing.       Assessment & Plan:  Multiple questions answered,   I spent more than 25 min with the patient, >50% of the time counseling

## 2012-11-07 NOTE — Patient Instructions (Addendum)
Motrin 200 mg 2-3  tablets every 6 hours as needed for pain. Always take it with food. Watch for stomach side effects (gastritis): nausea, stomach pain, change in the color of stools. If no better in 2 weeks or if worse, call for a referral. Schedule a physical at your convenience   Meralgia Paresthetica  Meralgia paresthetica (MP) is a disorder characterized by tingling, numbness, and burning pain in the outer side of the thigh. It occurs in men more than women. MP is generally found in middle-aged or overweight people. Sometimes, the disorder may disappear. CAUSES The disorder is caused by a nerve in the thigh being squeezed (compressed). MP may be associated with tight clothing, pregnancy, diabetes, and being overweight (obese). SYMPTOMS  Tingling, numbness, and burning in the outer thigh.  An area of the skin may be painful and sensitive to the touch. The symptoms often worsen after walking or standing. TREATMENT  Treatment is based on your symptoms and is mainly supportive. Treatment may include:  Wearing looser clothing.  Losing weight.  Avoiding prolonged standing or walking.  Taking medication.  Surgery if the pain is peristent or severe. MP usually eases or disappears after treatment. Surgery is not always fully successful. Document Released: 07/15/2002 Document Revised: 10/17/2011 Document Reviewed: 07/25/2005 [facility] Patient Information 2013 [place], [place].

## 2012-11-08 ENCOUNTER — Encounter: Payer: Self-pay | Admitting: *Deleted

## 2012-11-15 ENCOUNTER — Encounter: Payer: BC Managed Care – PPO | Attending: General Surgery | Admitting: *Deleted

## 2012-11-15 DIAGNOSIS — Z713 Dietary counseling and surveillance: Secondary | ICD-10-CM | POA: Insufficient documentation

## 2012-11-15 DIAGNOSIS — E669 Obesity, unspecified: Secondary | ICD-10-CM | POA: Insufficient documentation

## 2012-11-16 ENCOUNTER — Encounter: Payer: Self-pay | Admitting: *Deleted

## 2012-11-16 NOTE — Progress Notes (Signed)
Supervised Weight Loss Visit on 11/15/2012  Patient here from 9:30 to 10:00 AM for Group Supervised Weight Loss Visit Current weight: 351.6 pounds, indicates weight loss of 5 pounds since last visit here on 10/19/2012.  He states behavior changes include: Eating at home more often than eating out Drinking less beer which he tends to only drink in restaurants Increasing his activity level and planning to walk before work starting next week for about an hour in the AM daily. Is drinking protein shakes in addition to meals that have no more than 600 calories at a time.   Plan, continue with current behaviors and increase activity through walking as tolerated Next visit is scheduled for May 8th, 2014

## 2012-11-19 ENCOUNTER — Other Ambulatory Visit: Payer: Self-pay | Admitting: Internal Medicine

## 2012-11-20 NOTE — Telephone Encounter (Signed)
Refill done.  

## 2012-12-04 ENCOUNTER — Ambulatory Visit: Payer: BC Managed Care – PPO | Admitting: Internal Medicine

## 2012-12-13 ENCOUNTER — Encounter: Payer: BC Managed Care – PPO | Attending: General Surgery | Admitting: *Deleted

## 2012-12-13 VITALS — Ht 67.0 in | Wt 353.0 lb

## 2012-12-13 DIAGNOSIS — E669 Obesity, unspecified: Secondary | ICD-10-CM | POA: Insufficient documentation

## 2012-12-13 DIAGNOSIS — Z713 Dietary counseling and surveillance: Secondary | ICD-10-CM | POA: Insufficient documentation

## 2012-12-13 NOTE — Progress Notes (Addendum)
  Supervised Weight Loss Visit: Pre-Operative Gastric Sleeve Surgery  Medical Nutrition Therapy:  Appt start time: 0940 end time:  1000.  Primary concerns today:  Pre-Operative Bariatric Surgery Nutrition Management  Start weight:  356.4 lbs (10/19/12) Current weight: 353.0 pounds, indicates weight gain of 1.4 pounds since last visit here on 11/15/2012. Total weight loss:  3.4 lbs (needs to lose 5% of body weight to meet insurance requirement = 17.8 lbs) BMI:  55.3 kg/m^2  Recent physical activity:  Slight increase in exercise, though states a need to "do better".  Estimated daily needs (for weight loss):  1600 calories 200g CHO 80g protein 45-50g fat  Progress Towards Goal(s):  In progress.  Nutritional Diagnosis:  [place]-3.3 Obesity related to past poor dietary habits and physical inactivity as evidenced by patient attending supervised weight loss for insurance approval of bariatric surgery.    Intervention:  Nutrition education including importance of exercise both pre- and post-op bariatric surgery.  Monitoring/Evaluation:  Dietary intake, exercise, lap band fills, and body weight. Follow up in 1 months for supervised weight loss visit.

## 2012-12-13 NOTE — Patient Instructions (Addendum)
Goals:  Eat 3 meals/day, Avoid meal skipping   Have lean, protein rich foods with all carbohydrates  Limit carbohydrate 2 servings/meal and 1 serving/snack  Choose more whole grains, lean protein, low-fat dairy, and fruits/non-starchy vegetables.   Aim for >30 min of physical activity daily  Limit sugar-sweetened beverages and concentrated sweets 

## 2012-12-14 ENCOUNTER — Encounter: Payer: Self-pay | Admitting: Internal Medicine

## 2012-12-14 ENCOUNTER — Ambulatory Visit (INDEPENDENT_AMBULATORY_CARE_PROVIDER_SITE_OTHER): Payer: BC Managed Care – PPO | Admitting: Internal Medicine

## 2012-12-14 VITALS — BP 116/76 | HR 97 | Ht 68.0 in | Wt 353.0 lb

## 2012-12-14 DIAGNOSIS — G4733 Obstructive sleep apnea (adult) (pediatric): Secondary | ICD-10-CM

## 2012-12-14 NOTE — Patient Instructions (Addendum)
Order- DME [facility]- Reduce BIPAP to 19/19     Dx OSA                                        - Replacement mask of choice and supplies  Please call as needed

## 2012-12-14 NOTE — Progress Notes (Signed)
10/25/11- 61 yoM former smoker followed for OSA complicated by HBP, obesity, rhinitis LOV-10/25/10 He remains very pleased with his BiPAP set at 21/21 from advanced. Describes frequent sinus infections over the past year. Working with an Producer, television/film/video and throat physician in [place] and improved. Tonsils became enlarged and the right one stayed enlarged so he is pending tonsillectomy. Denies recent headache, purulent discharge, sore throat, cough or wheeze. Sleeps well.  12/14/12- 27 yoM former smoker followed for OSA complicated by HBP, obesity, rhinitis FOLLOWS FOR: Currently using BiPAP 21/21/[facility] every night for 6 hours. Reports needing a new mask and feels the pressure is too high. Denies problems with the machine. Tonsillectomy 2013. Feels needs lower pressure now. Not snoring through. Compliance w/ BIPAP is good. Minor pollen scratchy.  ROS-see HPI Constitutional:   No-   weight loss, night sweats, fevers, chills, fatigue, lassitude. HEENT:   No-  headaches, difficulty swallowing, tooth/dental problems, sore throat,       No-  sneezing, itching, ear ache, nasal congestion, +post nasal drip,  CV:  No-   chest pain, orthopnea, PND, swelling in lower extremities, anasarca,  dizziness, palpitations Resp: No-   shortness of breath with exertion or at rest.              No-   productive cough,  No non-productive cough,  No- coughing up of blood.              No-   change in color of mucus.  No- wheezing.   Skin: No-   rash or lesions. GI:  No-   heartburn, indigestion, abdominal pain, nausea, vomiting,  GU:  MS:  No-   joint pain or swelling.   Neuro-     nothing unusual Psych:  No- change in mood or affect. No depression or anxiety.  No memory loss.  OBJ- Physical Exam General- Alert, Oriented, Affect-appropriate, Distress- none acute. Obese Skin- rash-none, lesions- none, excoriation- none Lymphadenopathy- none Head- atraumatic            Eyes- Gross vision intact, PERRLA,  conjunctivae and secretions clear            Ears- Hearing, canals-normal            Nose- Clear, no-Septal dev, mucus, polyps, erosion, perforation             Throat- Mallampati III , mucosa clear , drainage- none, tonsils- absent Neck- flexible , trachea midline, no stridor , thyroid nl, carotid no bruit Chest - symmetrical excursion , unlabored           Heart/CV- RRR , no murmur , no gallop  , no rub, nl s1 s2                           - JVD- none , edema- none, stasis changes- none, varices- none           Lung- clear to P&A, wheeze- none, cough- none , dullness-none, rub- none           Chest wall-  Abd-  Br/ Gen/ Rectal- Not done, not indicated Extrem- cyanosis- none, clubbing, none, atrophy- none, strength- nl Neuro- grossly intact to observation

## 2012-12-14 NOTE — Assessment & Plan Note (Signed)
Good compliance and control. Weight loss would help- he is aware. Plan- Reduce pressure to 19/19. Replacement mask. Discussed options.

## 2012-12-27 ENCOUNTER — Encounter: Payer: BC Managed Care – PPO | Admitting: Internal Medicine

## 2013-01-17 ENCOUNTER — Encounter: Payer: BC Managed Care – PPO | Attending: General Surgery | Admitting: *Deleted

## 2013-01-17 VITALS — Ht 67.0 in | Wt 336.2 lb

## 2013-01-17 DIAGNOSIS — E669 Obesity, unspecified: Secondary | ICD-10-CM | POA: Insufficient documentation

## 2013-01-17 DIAGNOSIS — Z713 Dietary counseling and surveillance: Secondary | ICD-10-CM | POA: Insufficient documentation

## 2013-01-17 NOTE — Progress Notes (Signed)
  Supervised Weight Loss Visit: Pre-Operative Gastric Sleeve Surgery  Medical Nutrition Therapy:  Appt start time: 0930  End time:  1000.  Primary concerns today:  Pre-Operative Bariatric Surgery Nutrition Management  Start weight:  356.4 lbs (10/19/12) Current weight: 336.2 lbs Total weight loss:  20.2 lbs BMI:  52.7 kg/m^2  Recent physical activity:  Increased daily exercise  Estimated daily needs (for weight loss):  1600 calories 200g CHO 80g protein 45-50g fat  Progress Towards Goal(s):  In progress.  Nutritional Diagnosis:  [place]-3.3 Obesity related to past poor dietary habits and physical inactivity as evidenced by patient attending supervised weight loss for insurance approval of bariatric surgery.    Intervention: Nutrition education including emotional eating and the importance of exercise both pre- and post-op bariatric surgery.   Handouts given during class include:   Emotional Eating: The Facts"  Monitoring/Evaluation: Dietary intake, exercise, and body weight. Follow up for Pre-Op class once notified of surgery date.

## 2013-01-29 NOTE — Progress Notes (Signed)
Surgery scheduled for 02/12/13.  Preop appointment 01/3013 at 1pm.  Need orders in EPIC.  Thank You

## 2013-01-30 ENCOUNTER — Encounter (HOSPITAL_COMMUNITY): Payer: Self-pay | Admitting: Pharmacy Technician

## 2013-01-31 ENCOUNTER — Encounter: Payer: BC Managed Care – PPO | Admitting: *Deleted

## 2013-01-31 ENCOUNTER — Other Ambulatory Visit (INDEPENDENT_AMBULATORY_CARE_PROVIDER_SITE_OTHER): Payer: Self-pay | Admitting: General Surgery

## 2013-01-31 VITALS — Ht 67.0 in | Wt 331.0 lb

## 2013-01-31 DIAGNOSIS — E669 Obesity, unspecified: Secondary | ICD-10-CM

## 2013-01-31 NOTE — Progress Notes (Addendum)
Bariatric Class:  Appt start time: 0830   End time:  0930.  Pre-Operative Nutrition Class  Patient was seen on 01/31/2013 for Pre-Operative Bariatric Surgery Education at [facility].   Surgery date: 02/13/13 Surgery type: Sleeve Gastrectomy Start weight at [facility]: 356.4 lbs (10/19/12)  Weight today: 331.0 lbs Weight change: 5.2 lbs Total weight lost: 25.4 lbs (from start weight)  TANITA BODY COMP RESULTS   10/19/12  01/31/13  BMI (kg/m^2)  55.5  51.8  Fat Mass (lbs)  131.5  111.0  Fat Free Mass (lbs)  223.0  220.0  Total Body Water (lbs)  163.0  161.0   Samples given per MNT protocol; Patient educated on appropriate usage: Bariatric Advantage Multivitamin Lot # [id number] Exp: 06/15  Celebrate Vitamins Calcium Citrate Lot # [id number] Exp: 03/16  Celebrate Vitamins Multivitamin Lot # [id number] Exp: 07/15  Celebrate Vitamins Sublingual B12 Lot # [id number] Exp: 11/15  Unjury Protein Powder Lot # [id number] Exp: 09/15  Premier Protein Shake Lot # [id number] Exp: 08/20/13  The following the learning objective met by the patient during this course:  Identify Pre-Op Dietary Goals and will begin 2 weeks pre-operatively  Identify appropriate sources of fluids and proteins   State protein recommendations and appropriate sources pre and post-operatively  Identify Post-Operative Dietary Goals and will follow for 2 weeks post-operatively  Identify appropriate multivitamin and calcium sources  Describe the need for physical activity post-operatively and will follow MD recommendations  State when to call healthcare provider regarding medication questions or post-operative complications  Handouts given during class include:  Pre-Op Bariatric Surgery Diet Handout  Protein Shake Handout  Post-Op Bariatric Surgery Nutrition Handout  [facility]ELT Program Information Flyer  Support Group Information Flyer  [facility] Bariatric Supplements Price  List  Follow-Up Plan: Patient will follow-up at [facility] 2 weeks post operatively for diet advancement per MD.

## 2013-02-04 ENCOUNTER — Ambulatory Visit (HOSPITAL_COMMUNITY)
Admission: RE | Admit: 2013-02-04 | Discharge: 2013-02-04 | Disposition: A | Discharge status: Home / Self Care | Payer: BC Managed Care – PPO | Source: Ambulatory Visit | Attending: General Surgery | Admitting: General Surgery

## 2013-02-04 ENCOUNTER — Encounter (HOSPITAL_COMMUNITY): Payer: Self-pay

## 2013-02-04 ENCOUNTER — Encounter (HOSPITAL_COMMUNITY)
Admission: RE | Admit: 2013-02-04 | Discharge: 2013-02-04 | Disposition: A | Discharge status: Home / Self Care | Payer: BC Managed Care – PPO | Source: Ambulatory Visit | Attending: General Surgery | Admitting: General Surgery

## 2013-02-04 DIAGNOSIS — Z01818 Encounter for other preprocedural examination: Secondary | ICD-10-CM | POA: Insufficient documentation

## 2013-02-04 LAB — CBC WITH DIFFERENTIAL/PLATELET
Basophils Relative: 0 % (ref 0–1)
HCT: 45.6 % (ref 39.0–52.0)
Hemoglobin: 15.3 g/dL (ref 13.0–17.0)
Lymphocytes Relative: 18 % (ref 12–46)
Lymphs Abs: 2.1 10*3/uL (ref 0.7–4.0)
Monocytes Absolute: 0.6 10*3/uL (ref 0.1–1.0)
Monocytes Relative: 5 % (ref 3–12)
Neutro Abs: 9.1 10*3/uL — ABNORMAL HIGH (ref 1.7–7.7)
Neutrophils Relative %: 76 % (ref 43–77)
RBC: 5.22 MIL/uL (ref 4.22–5.81)
WBC: 11.9 10*3/uL — ABNORMAL HIGH (ref 4.0–10.5)

## 2013-02-04 LAB — COMPREHENSIVE METABOLIC PANEL
Albumin: 4 g/dL (ref 3.5–5.2)
Alkaline Phosphatase: 74 U/L (ref 39–117)
BUN: 18 mg/dL (ref 6–23)
CO2: 26 mEq/L (ref 19–32)
Chloride: 101 mEq/L (ref 96–112)
Creatinine, Ser: 0.68 mg/dL (ref 0.50–1.35)
GFR calc non Af Amer: >90 mL/min (ref >90)
Potassium: 4.1 mEq/L (ref 3.5–5.1)
Total Bilirubin: 0.3 mg/dL (ref 0.3–1.2)

## 2013-02-04 NOTE — Progress Notes (Signed)
EKG 3/14 EPIC 

## 2013-02-04 NOTE — Patient Instructions (Addendum)
20 Aaron Whitehead  02/04/2013   Your procedure is scheduled on:  02/12/13  TUESDAY  Report to [facility] at   0745    AM.  Call this number if you have problems the morning of surgery: [phone]       Remember: BRING CPAP MASK AND TUBING WITH YOU TO HOSPITAL  Do not eat food  Or drink :After Midnight. Monday NIGHT   Take these medicines the morning of surgery with A SIP OF WATER:Plendil (felodipine)   .  Contacts, dentures or partial plates can not be worn to surgery  Leave suitcase in the car. After surgery it may be brought to your room.  For patients admitted to the hospital, checkout time is 11:00 AM day of  discharge.             SPECIAL INSTRUCTIONS- SEE [place] PREPARING FOR SURGERY INSTRUCTION SHEET-     DO NOT WEAR JEWELRY, LOTIONS, POWDERS, OR PERFUMES.  WOMEN-- DO NOT SHAVE LEGS OR UNDERARMS FOR 12 HOURS BEFORE SHOWERS. MEN MAY SHAVE FACE.  Patients discharged the day of surgery will not be allowed to drive home. IF going home the day of surgery, you must have a driver and someone to stay with you for the first 24 hours  Name and phone number of your driver:        ADMISSION                                                                                                                   Aaron Whitehead  [phone]                 FAILURE TO FOLLOW THESE INSTRUCTIONS MAY RESULT IN  CANCELLATION   OF YOUR SURGERY                                                  Patient Signature _____________________________

## 2013-02-07 ENCOUNTER — Encounter (INDEPENDENT_AMBULATORY_CARE_PROVIDER_SITE_OTHER): Payer: Self-pay | Admitting: General Surgery

## 2013-02-07 ENCOUNTER — Ambulatory Visit (INDEPENDENT_AMBULATORY_CARE_PROVIDER_SITE_OTHER): Payer: BC Managed Care – PPO | Admitting: General Surgery

## 2013-02-07 DIAGNOSIS — Z6841 Body Mass Index (BMI) 40.0 and over, adult: Secondary | ICD-10-CM

## 2013-02-07 NOTE — Progress Notes (Signed)
Patient ID: Aaron Whitehead, male   DOB: 06-21-1971, 42 y.o.   MRN: [id number]  Chief Complaint  Patient presents with  . Bariatric Pre-op    sleeve    HPI Aaron Whitehead is a 42 y.o. male.  This patient presents for his preoperative surgery evaluation in preparation for vertical sleeve gastrectomy next week. He has a BMI of 51.4 and comorbidities of obstructive sleep apnea, hypertension, and asthma. He has lost about 30 pounds since we started this workup and has lost about 20 pounds on the preoperative diet. He remains interested in a vertical sleeve gastrectomy and denies any heartburn HPI  Past Medical History  Diagnosis Date  . Hypertension 2007  . Obesity   . OSA (obstructive sleep apnea)     NPSG 03/09/10 AHI 99.9/hr, CPAP 21/AHI 0.9/hr  . Unspecified asthma(493.90) 11/07/2012    Past Surgical History  Procedure Laterality Date  . Carpal tunnel release      bilat  . Adenoidectomy    . Tonsillectomy      Family History  Problem Relation Age of Onset  . Diabetes      GM  . Heart attack      GF  . Prostate cancer Neg Hx   . Colon cancer Neg Hx   . Hypertension Mother   . Coronary artery disease Father     had cardiomegaly +smoker     Social History History  Substance Use Topics  . Smoking status: Former Smoker -- 0.50 packs/day for 8 years    Types: Cigarettes    Quit date: 08/08/1992  . Smokeless tobacco: Never Used  . Alcohol Use: Yes     Comment: 4-5 beer/ liquor week/ none x 1 month    No Known Allergies  Current Outpatient Prescriptions  Medication Sig Dispense Refill  . cetirizine (ZYRTEC) 10 MG tablet Take 10 mg by mouth as needed for allergies.      . felodipine (PLENDIL) 10 MG 24 hr tablet Take 10 mg by mouth every morning.      Marland Kitchen ibuprofen (ADVIL) 200 MG tablet Take 800 mg by mouth every 6 (six) hours as needed (For back pain.).      Marland Kitchen lidocaine (LIDODERM) 5 % Place 1 patch onto the skin daily as needed (For pain.). Remove & Discard patch within 12  hours or as directed by MD      . losartan (COZAAR) 50 MG tablet Take 50 mg by mouth every morning.       No current facility-administered medications for this visit.    Review of Systems Review of Systems All other review of systems negative or noncontributory except as stated in the HPI  Blood pressure 122/86, pulse 91, temperature 96.8 F (36 C), temperature source Temporal, height 5\' 7"  (1.702 m), weight 328 lb (148.78 kg), SpO2 97.00%.  Physical Exam Physical Exam Physical Exam  Vitals reviewed. Constitutional: He is oriented to person, place, and time. He appears well-developed and well-nourished. No distress.  HENT:  Head: Normocephalic and atraumatic.  Mouth/Throat: No oropharyngeal exudate.  Eyes: Conjunctivae and EOM are normal. Pupils are equal, round, and reactive to light. Right eye exhibits no discharge. Left eye exhibits no discharge. No scleral icterus.  Neck: Normal range of motion. No tracheal deviation present.  Cardiovascular: Normal rate, regular rhythm and normal heart sounds.   Pulmonary/Chest: Effort normal and breath sounds normal. No stridor. No respiratory distress. He has no wheezes. He has no rales. He exhibits no tenderness.  Abdominal: Soft. Bowel sounds are normal. He exhibits no distension and no mass. There is no tenderness. There is no rebound and no guarding.  Musculoskeletal: Normal range of motion. He exhibits no edema and no tenderness.  Neurological: He is alert and oriented to person, place, and time.  Skin: Skin is warm and dry. No rash noted. He is not diaphoretic. No erythema. No pallor.  Psychiatric: He has a normal mood and affect. His behavior is normal. Judgment and thought content normal.    Data Reviewed   Assessment    Morbid obesity with a BMI of 51 and comorbidities of sleep apnea, hypertension, and asthma He remains motivated for vertical sleeve Gastrectomy and a fine candidate for the procedure. We discussed the  perioperative instructions and hospital course as well the procedure and its risks. The risks of infection, bleeding, pain, scarring, weight regain, too little or too much weight loss, vitamin deficiencies and need for lifelong vitamin supplementation, hair loss, need for protein supplementation, leaks, stricture, reflux, food intolerance, need for reoperation and conversion to roux Y gastric bypass, need for open surgery, injury to spleen or surrounding structures, DVT's, PE, and death again discussed with the patient and the patient expressed understanding and desires to proceed with laparoscopic vertical sleeve gastrectomy, possible open, intraoperative endoscopy.     Plan    We will plan for vertical sleeve gastrectomy next week        Mckinna Demars DAVID 02/07/2013, 5:05 PM

## 2013-02-11 MED ORDER — SODIUM CHLORIDE 0.9 % IV SOLN
1.0000 g | INTRAVENOUS | Status: AC
  Administered 2013-02-12: 1 g via INTRAVENOUS
  Filled 2013-02-11: qty 1

## 2013-02-12 ENCOUNTER — Encounter (HOSPITAL_COMMUNITY): Payer: Self-pay | Admitting: *Deleted

## 2013-02-12 ENCOUNTER — Inpatient Hospital Stay (HOSPITAL_COMMUNITY)
Admission: RE | Admit: 2013-02-12 | Discharge: 2013-02-14 | DRG: 293 | Disposition: A | Discharge status: Home / Self Care | Payer: BC Managed Care – PPO | Source: Ambulatory Visit | Attending: General Surgery | Admitting: General Surgery

## 2013-02-12 ENCOUNTER — Inpatient Hospital Stay (HOSPITAL_COMMUNITY): Payer: BC Managed Care – PPO | Admitting: Anesthesiology

## 2013-02-12 ENCOUNTER — Encounter (HOSPITAL_COMMUNITY): Payer: Self-pay | Admitting: Anesthesiology

## 2013-02-12 ENCOUNTER — Encounter (HOSPITAL_COMMUNITY)
Admission: RE | Disposition: A | Discharge status: Home / Self Care | Payer: Self-pay | Source: Ambulatory Visit | Attending: General Surgery

## 2013-02-12 DIAGNOSIS — J45909 Unspecified asthma, uncomplicated: Secondary | ICD-10-CM

## 2013-02-12 DIAGNOSIS — I10 Essential (primary) hypertension: Secondary | ICD-10-CM

## 2013-02-12 DIAGNOSIS — R110 Nausea: Secondary | ICD-10-CM | POA: Diagnosis present

## 2013-02-12 DIAGNOSIS — Z6841 Body Mass Index (BMI) 40.0 and over, adult: Secondary | ICD-10-CM

## 2013-02-12 DIAGNOSIS — G4733 Obstructive sleep apnea (adult) (pediatric): Secondary | ICD-10-CM | POA: Diagnosis present

## 2013-02-12 HISTORY — PX: LAPAROSCOPIC GASTRIC SLEEVE RESECTION: SHX5895

## 2013-02-12 HISTORY — PX: ESOPHAGOGASTRODUODENOSCOPY: SHX5428

## 2013-02-12 SURGERY — GASTRECTOMY, SLEEVE, LAPAROSCOPIC
Anesthesia: General | Site: Esophagus | Wound class: Clean Contaminated

## 2013-02-12 MED ORDER — PROMETHAZINE HCL 25 MG/ML IJ SOLN: 6.2500 mg | INTRAMUSCULAR | Status: DC | PRN

## 2013-02-12 MED ORDER — TISSEEL VH 10 ML EX KIT
PACK | CUTANEOUS | Status: DC | PRN
  Administered 2013-02-12: 1

## 2013-02-12 MED ORDER — UNJURY CHICKEN SOUP POWDER: 2.0000 [oz_av] | Freq: Four times a day (QID) | ORAL | Status: DC

## 2013-02-12 MED ORDER — ROCURONIUM BROMIDE 100 MG/10ML IV SOLN
INTRAVENOUS | Status: DC | PRN
  Administered 2013-02-12: 50 mg via INTRAVENOUS
  Administered 2013-02-12: 10 mg via INTRAVENOUS
  Administered 2013-02-12: 20 mg via INTRAVENOUS
  Administered 2013-02-12: 10 mg via INTRAVENOUS

## 2013-02-12 MED ORDER — KCL IN DEXTROSE-NACL 20-5-0.45 MEQ/L-%-% IV SOLN
INTRAVENOUS | Status: DC
  Administered 2013-02-12 – 2013-02-14 (×3): via INTRAVENOUS
  Filled 2013-02-12 (×7): qty 1000

## 2013-02-12 MED ORDER — HEPARIN SODIUM (PORCINE) 5000 UNIT/ML IJ SOLN
5000.0000 [IU] | Freq: Once | INTRAMUSCULAR | Status: AC
  Administered 2013-02-12: 5000 [IU] via SUBCUTANEOUS
  Filled 2013-02-12: qty 1

## 2013-02-12 MED ORDER — HYDROMORPHONE HCL PF 1 MG/ML IJ SOLN
INTRAMUSCULAR | Status: DC | PRN
  Administered 2013-02-12 (×2): 0.5 mg via INTRAVENOUS
  Administered 2013-02-12: 1 mg via INTRAVENOUS
  Administered 2013-02-12: 0.5 mg via INTRAVENOUS
  Administered 2013-02-12: 1 mg via INTRAVENOUS
  Administered 2013-02-12: 0.5 mg via INTRAVENOUS

## 2013-02-12 MED ORDER — LACTATED RINGERS IR SOLN
Status: DC | PRN
  Administered 2013-02-12: 1000 mL

## 2013-02-12 MED ORDER — ACETAMINOPHEN 160 MG/5ML PO SOLN: 650.0000 mg | ORAL | Status: DC | PRN

## 2013-02-12 MED ORDER — LACTATED RINGERS IV SOLN: INTRAVENOUS | Status: DC

## 2013-02-12 MED ORDER — BUPIVACAINE HCL 0.25 % IJ SOLN
INTRAMUSCULAR | Status: DC | PRN
  Administered 2013-02-12: 20 mL

## 2013-02-12 MED ORDER — UNJURY VANILLA POWDER: 2.0000 [oz_av] | Freq: Four times a day (QID) | ORAL | Status: DC

## 2013-02-12 MED ORDER — LACTATED RINGERS IV SOLN
INTRAVENOUS | Status: DC
  Administered 2013-02-12: 13:00:00 via INTRAVENOUS
  Administered 2013-02-12: 1000 mL via INTRAVENOUS
  Administered 2013-02-12: 11:00:00 via INTRAVENOUS

## 2013-02-12 MED ORDER — SUCCINYLCHOLINE CHLORIDE 20 MG/ML IJ SOLN
INTRAMUSCULAR | Status: DC | PRN
  Administered 2013-02-12: 180 mg via INTRAVENOUS

## 2013-02-12 MED ORDER — GLYCOPYRROLATE 0.2 MG/ML IJ SOLN
INTRAMUSCULAR | Status: DC | PRN
  Administered 2013-02-12: .7 mg via INTRAVENOUS

## 2013-02-12 MED ORDER — ENOXAPARIN SODIUM 40 MG/0.4ML ~~LOC~~ SOLN
40.0000 mg | Freq: Two times a day (BID) | SUBCUTANEOUS | Status: DC
  Administered 2013-02-13 – 2013-02-14 (×3): 40 mg via SUBCUTANEOUS
  Filled 2013-02-12 (×6): qty 0.4

## 2013-02-12 MED ORDER — MIDAZOLAM HCL 5 MG/5ML IJ SOLN
INTRAMUSCULAR | Status: DC | PRN
  Administered 2013-02-12: 2 mg via INTRAVENOUS

## 2013-02-12 MED ORDER — MORPHINE SULFATE 2 MG/ML IJ SOLN
2.0000 mg | INTRAMUSCULAR | Status: DC | PRN
  Administered 2013-02-12 – 2013-02-13 (×4): 4 mg via INTRAVENOUS
  Filled 2013-02-12 (×2): qty 2

## 2013-02-12 MED ORDER — NEOSTIGMINE METHYLSULFATE 1 MG/ML IJ SOLN
INTRAMUSCULAR | Status: DC | PRN
  Administered 2013-02-12: 4 mg via INTRAVENOUS

## 2013-02-12 MED ORDER — LIDOCAINE HCL (CARDIAC) 20 MG/ML IV SOLN
INTRAVENOUS | Status: DC | PRN
  Administered 2013-02-12: 50 mg via INTRAVENOUS

## 2013-02-12 MED ORDER — PROPOFOL 10 MG/ML IV BOLUS
INTRAVENOUS | Status: DC | PRN
  Administered 2013-02-12: 200 mg via INTRAVENOUS

## 2013-02-12 MED ORDER — HYDROMORPHONE HCL PF 1 MG/ML IJ SOLN: 0.2500 mg | INTRAMUSCULAR | Status: DC | PRN

## 2013-02-12 MED ORDER — UNJURY CHOCOLATE CLASSIC POWDER: 2.0000 [oz_av] | Freq: Four times a day (QID) | ORAL | Status: DC

## 2013-02-12 MED ORDER — ONDANSETRON HCL 4 MG/2ML IJ SOLN
INTRAMUSCULAR | Status: DC | PRN
  Administered 2013-02-12: 4 mg via INTRAVENOUS

## 2013-02-12 MED ORDER — OXYCODONE-ACETAMINOPHEN 5-325 MG/5ML PO SOLN
5.0000 mL | ORAL | Status: DC | PRN
  Administered 2013-02-13 – 2013-02-14 (×4): 10 mL via ORAL
  Filled 2013-02-12 (×3): qty 10
  Filled 2013-02-12: qty 5
  Filled 2013-02-12: qty 10

## 2013-02-12 MED ORDER — FENTANYL CITRATE 0.05 MG/ML IJ SOLN
INTRAMUSCULAR | Status: DC | PRN
  Administered 2013-02-12: 50 ug via INTRAVENOUS
  Administered 2013-02-12: 100 ug via INTRAVENOUS
  Administered 2013-02-12 (×2): 50 ug via INTRAVENOUS

## 2013-02-12 MED ORDER — ONDANSETRON HCL 4 MG/2ML IJ SOLN
4.0000 mg | INTRAMUSCULAR | Status: DC | PRN
  Administered 2013-02-12 – 2013-02-13 (×3): 4 mg via INTRAVENOUS
  Filled 2013-02-12 (×2): qty 2

## 2013-02-12 MED ORDER — LIDOCAINE-EPINEPHRINE 1 %-1:100000 IJ SOLN
INTRAMUSCULAR | Status: DC | PRN
  Administered 2013-02-12: 20 mL

## 2013-02-12 SURGICAL SUPPLY — 56 items
ADH SKN CLS APL DERMABOND .7 (GAUZE/BANDAGES/DRESSINGS) ×2
APL SRG 32X5 SNPLK LF DISP (MISCELLANEOUS) ×2
APPLICATOR COTTON TIP 6IN STRL (MISCELLANEOUS) IMPLANT
APPLIER CLIP ROT 10 11.4 M/L (STAPLE)
APR CLP MED LRG 11.4X10 (STAPLE)
BAG SPEC RTRVL LRG 6X4 10 (ENDOMECHANICALS) ×2
CABLE HIGH FREQUENCY MONO STRZ (ELECTRODE) IMPLANT
CANISTER SUCTION 2500CC (MISCELLANEOUS) ×4 IMPLANT
CHLORAPREP W/TINT 26ML (MISCELLANEOUS) ×6 IMPLANT
CLIP APPLIE ROT 10 11.4 M/L (STAPLE) IMPLANT
CLOTH BEACON ORANGE TIMEOUT ST (SAFETY) ×3 IMPLANT
DERMABOND ADVANCED (GAUZE/BANDAGES/DRESSINGS) ×1
DERMABOND ADVANCED .7 DNX12 (GAUZE/BANDAGES/DRESSINGS) IMPLANT
DEVICE SUTURE ENDOST 10MM (ENDOMECHANICALS) ×1 IMPLANT
DRAIN CHANNEL 19F RND (DRAIN) ×3 IMPLANT
DRAPE LAPAROSCOPIC ABDOMINAL (DRAPES) ×3 IMPLANT
DRAPE UTILITY XL STRL (DRAPES) ×6 IMPLANT
ELECT REM PT RETURN 9FT ADLT (ELECTROSURGICAL) ×3
ELECTRODE REM PT RTRN 9FT ADLT (ELECTROSURGICAL) ×2 IMPLANT
EVACUATOR SILICONE 100CC (DRAIN) ×3 IMPLANT
GLOVE SURG SS PI 7.5 STRL IVOR (GLOVE) ×6 IMPLANT
GOWN STRL NON-REIN LRG LVL3 (GOWN DISPOSABLE) IMPLANT
GOWN STRL REIN XL XLG (GOWN DISPOSABLE) ×11 IMPLANT
HANDLE STAPLE EGIA 4 XL (STAPLE) ×1 IMPLANT
HOVERMATT SINGLE USE (MISCELLANEOUS) ×3 IMPLANT
KIT BASIN OR (CUSTOM PROCEDURE TRAY) ×3 IMPLANT
MARKER SKIN DUAL TIP RULER LAB (MISCELLANEOUS) ×3 IMPLANT
NDL SPNL 22GX3.5 QUINCKE BK (NEEDLE) IMPLANT
NEEDLE SPNL 22GX3.5 QUINCKE BK (NEEDLE) IMPLANT
NS IRRIG 1000ML POUR BTL (IV SOLUTION) ×3 IMPLANT
PENCIL BUTTON HOLSTER BLD 10FT (ELECTRODE) ×3 IMPLANT
POUCH SPECIMEN RETRIEVAL 10MM (ENDOMECHANICALS) ×1 IMPLANT
RELOAD EGIA 60 MED/THCK PURPLE (STAPLE) ×15 IMPLANT
RELOAD STAPLE 60 BLK XTHK ART (STAPLE) IMPLANT
RELOAD STAPLE 60 MED/THCK ART (STAPLE) IMPLANT
RELOAD TRI 2.0 60 XTHK VAS SUL (STAPLE) ×6 IMPLANT
SCISSORS LAP 5X35 DISP (ENDOMECHANICALS) IMPLANT
SEALANT SURGICAL APPL DUAL CAN (MISCELLANEOUS) ×3 IMPLANT
SET IRRIG TUBING LAPAROSCOPIC (IRRIGATION / IRRIGATOR) ×3 IMPLANT
SHEARS CURVED HARMONIC AC 45CM (MISCELLANEOUS) ×3 IMPLANT
SLEEVE XCEL OPT CAN 5 100 (ENDOMECHANICALS) ×6 IMPLANT
SOLUTION ANTI FOG 6CC (MISCELLANEOUS) ×3 IMPLANT
SPONGE GAUZE 4X4 12PLY (GAUZE/BANDAGES/DRESSINGS) ×1 IMPLANT
SPONGE LAP 18X18 X RAY DECT (DISPOSABLE) ×3 IMPLANT
SUT ETHILON 2 0 PS N (SUTURE) ×3 IMPLANT
SUT MNCRL AB 4-0 PS2 18 (SUTURE) ×6 IMPLANT
SUT VICRYL 0 UR6 27IN ABS (SUTURE) ×3 IMPLANT
SYR 50ML LL SCALE MARK (SYRINGE) ×3 IMPLANT
TRAY FOLEY CATH 14FRSI W/METER (CATHETERS) ×3 IMPLANT
TRAY LAP CHOLE (CUSTOM PROCEDURE TRAY) ×3 IMPLANT
TROCAR BLADELESS 15MM (ENDOMECHANICALS) ×3 IMPLANT
TROCAR BLADELESS OPT 5 100 (ENDOMECHANICALS) ×3 IMPLANT
TROCAR XCEL 12X100 BLDLESS (ENDOMECHANICALS) ×1 IMPLANT
TUBING CONNECTING 10 (TUBING) ×3 IMPLANT
TUBING ENDO SMARTCAP (MISCELLANEOUS) ×3 IMPLANT
TUBING FILTER THERMOFLATOR (ELECTROSURGICAL) ×3 IMPLANT

## 2013-02-12 NOTE — Anesthesia Preprocedure Evaluation (Signed)
Anesthesia Evaluation  Patient identified by MRN, date of birth, ID band Patient awake    Reviewed: Allergy & Precautions, H&P , NPO status , Patient's Chart, lab work & pertinent test results  Airway Mallampati: II TM Distance: >3 FB Neck ROM: Full    Dental  (+) Teeth Intact and Dental Advisory Given   Pulmonary neg pulmonary ROS, asthma , sleep apnea and Continuous Positive Airway Pressure Ventilation , former smoker,  breath sounds clear to auscultation  Pulmonary exam normal       Cardiovascular hypertension, Pt. on medications Rhythm:Regular Rate:Normal     Neuro/Psych negative neurological ROS  negative psych ROS   GI/Hepatic negative GI ROS, Neg liver ROS,   Endo/Other  Morbid obesity  Renal/GU negative Renal ROS  negative genitourinary   Musculoskeletal negative musculoskeletal ROS (+)   Abdominal   Peds negative pediatric ROS (+)  Hematology negative hematology ROS (+)   Anesthesia Other Findings   Reproductive/Obstetrics                           Anesthesia Physical Anesthesia Plan  ASA: III  Anesthesia Plan: General   Post-op Pain Management:    Induction: Intravenous  Airway Management Planned: Oral ETT  Additional Equipment:   Intra-op Plan:   Post-operative Plan: Extubation in OR  Informed Consent: I have reviewed the patients History and Physical, chart, labs and discussed the procedure including the risks, benefits and alternatives for the proposed anesthesia with the patient or authorized representative who has indicated his/her understanding and acceptance.   Dental advisory given  Plan Discussed with: CRNA  Anesthesia Plan Comments:         Anesthesia Quick Evaluation

## 2013-02-12 NOTE — Brief Op Note (Signed)
02/12/2013  12:36 PM  PATIENT:  Aaron Whitehead  42 y.o. male  PRE-OPERATIVE DIAGNOSIS:  morbid obesity  POST-OPERATIVE DIAGNOSIS:  morbid obesity  PROCEDURE:  Procedure(s) with comments: LARAPROSCOPIC SLEEVE GASTRECTOMY WITH EGD (N/A) - LARAPROSCOPIC SLEEVE GASTRECTOMY WITH EGD ESOPHAGOGASTRODUODENOSCOPY (EGD) (N/A)  SURGEON:  Surgeon(s) and Role:    * Lodema Pilot, DO - Primary  PHYSICIAN ASSISTANT:   ASSISTANTS: martin  ANESTHESIA:   general  EBL:  Total I/O In: 1000 [I.V.:1000] Out: 500 [Urine:400; Blood:100]  BLOOD ADMINISTERED:none  DRAINS: (38F) Jackson-Pratt drain(s) with closed bulb suction in the sleeve staple line   LOCAL MEDICATIONS USED:  MARCAINE    and LIDOCAINE   SPECIMEN:  Source of Specimen:  greater curve stomach  DISPOSITION OF SPECIMEN:  PATHOLOGY  COUNTS:  YES  TOURNIQUET:  * No tourniquets in log *  DICTATION: .Other Dictation: Dictation Number dictated [id number]  PLAN OF CARE: Admit to inpatient   PATIENT DISPOSITION:  PACU - hemodynamically stable.   Delay start of Pharmacological VTE agent (>24hrs) due to surgical blood loss or risk of bleeding: no

## 2013-02-12 NOTE — Interval H&P Note (Signed)
History and Physical Interval Note:  02/12/2013 9:23 AM  Jennette Kettle  has presented today for surgery, with the diagnosis of morbid obesity  The various methods of treatment have been discussed with the patient and family. After consideration of risks, benefits and other options for treatment, the patient has consented to  Procedure(s) with comments: LARAPROSCOPIC SLEEVE GASTRECTOMY WITH EGD (N/A) - LARAPROSCOPIC SLEEVE GASTRECTOMY WITH EGD ESOPHAGOGASTRODUODENOSCOPY (EGD) (N/A) as a surgical intervention .  The patient's history has been reviewed, patient examined, no change in status, stable for surgery.  I have reviewed the patient's chart and labs.  Questions were answered to the patient's satisfaction.  I have seen and evaluated the patient and risks and benefits and alternatives again discussed with the patient in lay terms.  The risks of infection, bleeding, pain, scarring, weight regain, too little or too much weight loss, vitamin deficiencies and need for lifelong vitamin supplementation, hair loss, need for protein supplementation, leaks, stricture, reflux, food intolerance, need for reoperation and conversion to roux Y gastric bypass, need for open surgery, injury to spleen or surrounding structures, DVT's, PE, and death again discussed with the patient and the patient expressed understanding and desires to proceed with laparoscopic vertical sleeve gastrectomy, possible open, intraoperative endoscopy.    Lodema Pilot DAVID

## 2013-02-12 NOTE — Op Note (Signed)
Aaron Whitehead, Aaron Whitehead               ACCOUNT NO.:  [id number]  MEDICAL RECORD NO.:  [id number]  LOCATION:  1534                         FACILITY:  [facility]  PHYSICIAN:  Lodema Pilot, MD       DATE OF BIRTH:  02/11/1971  DATE OF PROCEDURE:  02/12/2013 DATE OF DISCHARGE:                              OPERATIVE REPORT   PROCEDURE:  Laparoscopic vertical sleeve gastrectomy with intraoperative upper endoscopy.  PREOPERATIVE DIAGNOSIS:  Morbid obesity.  POSTOP DIAGNOSIS:  Morbid obesity.  SURGEON:  Lodema Pilot, MD  ASSISTANT:  Dr. Daphine Deutscher.  ANESTHESIA:  General endotracheal anesthesia with 50 mL of 1% lidocaine with epinephrine and 0.25% Marcaine in a 50:50 mixture.  FLUIDS:  1500 mL of crystalloid.  ESTIMATED BLOOD LOSS:  100 mL.  DRAINS:  A 19-French Blake drain placed along the staple line.  SPECIMEN:  Greater curvature of the stomach sent to Pathology for permanent sectioning.  COMPLICATIONS:  None.  FINDINGS:  Vertical sleeve gastrectomy performed with 36-French bougie. A 19-French Blake drains were placed along the sleeve staple line and a small umbilical hernia containing preperitoneal fat.  INDICATION FOR PROCEDURE:  Mr. Decarlo is a 42 year old male with BMI of 51 and obesity related comorbidities with failed medical weight loss attempts and desires durable weight loss solution.  OPERATIVE DETAILS:  Mr. Kiehn was seen and evaluated in the preoperative area.  Risks and benefits, and procedure were again discussed in lay terms.  Informed consent was obtained.  Prophylactic heparin and prophylactic antibiotics were given.  He was taken to the operating room and placed on table in supine position and general endotracheal anesthesia was obtained.  Foley catheter replaced.  His abdomen was prepped and draped in the standard surgical fashion.  The procedure time- out was performed with all operative team members confirmed proper patient and procedure.  A 5-mm Optiview trocar  was used to access the left upper quadrant and pneumoperitoneum was obtained.  Laparoscope was introduced.  There was no evidence of bleeding or bowel injury upon entry.  A 5-mm left rectus port was placed, a 15-mm right rectus port, and 5-mm right upper quadrant port were all placed under direct visualization and a Nathanson liver retractor was used to retract the left lobe of the liver.  The liver was very large and floppy and the liver retractor had to be repositioned a few times during the procedure as we moved towards the area of dissection.  The gallbladder actually appeared to be originating from the left side of the falciform ligament as well just over the area of the pylorus that measured out 5 cm from the pylorus and began dividing the short gastric vessels.  The lesser sac was entered and dissection was carried around the greater curvature of the stomach using Harmonic scalpel.  The short gastric vessels were divided up towards the spleen and the stomach was separated from the spleen and from the left crus of diaphragm.  The left crus was identified and continued to mobilize the stomach off the left crus.  He did not appear to have any posterior hiatal hernia.  I mobilized the posterior gastric adhesions in the lesser sac.  Using sharp  dissection, we mobilized the stomach to the lesser vessels.  After the stomach was completely mobilized, I remeasured a 5 cm of the pylorus and took my first firing with creating a sleeve with a 60-mm black Tri-Staple load taking care to avoid narrowing of the stomach at the angle of His.  A second black 60 mm staple load was placed at the crotch of the prior staple line, and angled up towards the angle of His and the anticipated area angle of firings and a 36-French bougie was passed along the lesser curvature of the stomach into the antrum and pylorus region.  A second staple firing was taken and then I transitioned to a purple Tri-Staple load  with subsequent firings.  This angled up around the stomach.  I actually upsized my 5-mm left rectus port to a 12-mm trocar and used this as well as for some more advantageous angles of firing.  With each placement of the stapler, the bougie was checked and I carried the division up towards the angle of His.  With the final firing, I stayed off the epigastric fat pad and made sure that I could see some residual stomach in this area as to minimize a chance of injuring the esophagus. The stomach was completely transected and hemostasis was obtained along the sleeve staple line with a few hemoclips.  Dr. Daphine Deutscher performed upper endoscopy with a well-lubricated fiberoptic endoscope, which was passed down to the mouth and esophagus into the sleeve and antrum and pylorus. The sleeve was easily navigated and appeared tubular without any angling or evidence of internal bleeding.  While he had this insufflated with air, I had it submerged under water and there was no evidence of any air leakage.  The air was suctioned and the scope was removed.  The remainder of the fluid was suctioned from the abdomen and I enlarged my 15-mm port site and extracted the stomach through the right rectus 15 mm port site.  The stomach was actually very large and was sent to Pathology for permanent section.  The staple line was again inspected for hemostasis, which was noted to be adequate with the previously placed clips and I did feel fibrin glue was applied along the sleeve staple line.  The 19-French Harrison Mons drain was placed just posterior to the sleeve staple lines and the omentum was placed back over the stomach and drained and this was exited through the left upper quadrant trocar site and sutured in place with a nylon drain stitch.  The retractors was removed under direct visualization and the stomach extraction site was approximated with interrupted 0 Vicryl sutures in open fashion.  Sutures were secured and the  abdomen was re-insufflated with carbon dioxide gas to ensure there was no evidence of bleeding or bowel injury and none was identified.  He did have a small umbilical hernia containing preperitoneal fat, but there was no holes internally where intestine could incarcerate and I felt it would best to leave this for now until his weight loss has improved and likely later repaired.  I felt that placement of the sutures with Endo Close device would likely cut through and actually make the defect larger.  The final trocars were removed and the abdominal wound was noted be hemostatic.  The extraction site was irrigated with sterile saline solution.  The wounds were injected with a total of 50 mL of 1% lidocaine with epinephrine and 0.25% Marcaine in a 50:50 mixture.  Skin was washed and dried. The  skin edges were approximated with 4-0 Monocryl and subcuticular suture.  Skin was washed and dried and Dermabond was applied.  All sponge, needle, and instrument counts correct in the case.  The patient tolerated the procedure well without apparent complication.          ______________________________ Lodema Pilot, MD    BL/MEDQ  D:  02/12/2013  T:  02/12/2013  Job:  [id number]

## 2013-02-12 NOTE — Transfer of Care (Signed)
Immediate Anesthesia Transfer of Care Note  Patient: Aaron Whitehead  Procedure(s) Performed: Procedure(s) (LRB): LARAPROSCOPIC SLEEVE GASTRECTOMY WITH EGD (N/A) ESOPHAGOGASTRODUODENOSCOPY (EGD) (N/A)  Patient Location: PACU  Anesthesia Type: General  Level of Consciousness: sedated, patient cooperative and responds to stimulaton  Airway & Oxygen Therapy: Patient Spontanous Breathing and Patient connected to face mask oxgen  Post-op Assessment: Report given to PACU RN and Post -op Vital signs reviewed and stable  Post vital signs: Reviewed and stable  Complications: No apparent anesthesia complications

## 2013-02-12 NOTE — H&P (View-Only) (Signed)
Patient ID: Aaron Whitehead, male   DOB: 06/15/1971, 42 y.o.   MRN: [id number]  Chief Complaint  Patient presents with  . Bariatric Pre-op    sleeve    HPI Aaron Whitehead is a 42 y.o. male.  This patient presents for his preoperative surgery evaluation in preparation for vertical sleeve gastrectomy next week. He has a BMI of 51.4 and comorbidities of obstructive sleep apnea, hypertension, and asthma. He has lost about 30 pounds since we started this workup and has lost about 20 pounds on the preoperative diet. He remains interested in a vertical sleeve gastrectomy and denies any heartburn HPI  Past Medical History  Diagnosis Date  . Hypertension 2007  . Obesity   . OSA (obstructive sleep apnea)     NPSG 03/09/10 AHI 99.9/hr, CPAP 21/AHI 0.9/hr  . Unspecified asthma(493.90) 11/07/2012    Past Surgical History  Procedure Laterality Date  . Carpal tunnel release      bilat  . Adenoidectomy    . Tonsillectomy      Family History  Problem Relation Age of Onset  . Diabetes      GM  . Heart attack      GF  . Prostate cancer Neg Hx   . Colon cancer Neg Hx   . Hypertension Mother   . Coronary artery disease Father     had cardiomegaly +smoker     Social History History  Substance Use Topics  . Smoking status: Former Smoker -- 0.50 packs/day for 8 years    Types: Cigarettes    Quit date: 08/08/1992  . Smokeless tobacco: Never Used  . Alcohol Use: Yes     Comment: 4-5 beer/ liquor week/ none x 1 month    No Known Allergies  Current Outpatient Prescriptions  Medication Sig Dispense Refill  . cetirizine (ZYRTEC) 10 MG tablet Take 10 mg by mouth as needed for allergies.      . felodipine (PLENDIL) 10 MG 24 hr tablet Take 10 mg by mouth every morning.      . ibuprofen (ADVIL) 200 MG tablet Take 800 mg by mouth every 6 (six) hours as needed (For back pain.).      . lidocaine (LIDODERM) 5 % Place 1 patch onto the skin daily as needed (For pain.). Remove & Discard patch within 12  hours or as directed by MD      . losartan (COZAAR) 50 MG tablet Take 50 mg by mouth every morning.       No current facility-administered medications for this visit.    Review of Systems Review of Systems All other review of systems negative or noncontributory except as stated in the HPI  Blood pressure 122/86, pulse 91, temperature 96.8 F (36 C), temperature source Temporal, height 5' 7" (1.702 m), weight 328 lb (148.78 kg), SpO2 97.00%.  Physical Exam Physical Exam Physical Exam  Vitals reviewed. Constitutional: He is oriented to person, place, and time. He appears well-developed and well-nourished. No distress.  HENT:  Head: Normocephalic and atraumatic.  Mouth/Throat: No oropharyngeal exudate.  Eyes: Conjunctivae and EOM are normal. Pupils are equal, round, and reactive to light. Right eye exhibits no discharge. Left eye exhibits no discharge. No scleral icterus.  Neck: Normal range of motion. No tracheal deviation present.  Cardiovascular: Normal rate, regular rhythm and normal heart sounds.   Pulmonary/Chest: Effort normal and breath sounds normal. No stridor. No respiratory distress. He has no wheezes. He has no rales. He exhibits no tenderness.    Abdominal: Soft. Bowel sounds are normal. He exhibits no distension and no mass. There is no tenderness. There is no rebound and no guarding.  Musculoskeletal: Normal range of motion. He exhibits no edema and no tenderness.  Neurological: He is alert and oriented to person, place, and time.  Skin: Skin is warm and dry. No rash noted. He is not diaphoretic. No erythema. No pallor.  Psychiatric: He has a normal mood and affect. His behavior is normal. Judgment and thought content normal.    Data Reviewed   Assessment    Morbid obesity with a BMI of 51 and comorbidities of sleep apnea, hypertension, and asthma He remains motivated for vertical sleeve Gastrectomy and a fine candidate for the procedure. We discussed the  perioperative instructions and hospital course as well the procedure and its risks. The risks of infection, bleeding, pain, scarring, weight regain, too little or too much weight loss, vitamin deficiencies and need for lifelong vitamin supplementation, hair loss, need for protein supplementation, leaks, stricture, reflux, food intolerance, need for reoperation and conversion to roux Y gastric bypass, need for open surgery, injury to spleen or surrounding structures, DVT's, PE, and death again discussed with the patient and the patient expressed understanding and desires to proceed with laparoscopic vertical sleeve gastrectomy, possible open, intraoperative endoscopy.     Plan    We will plan for vertical sleeve gastrectomy next week        Keyoni Lapinski DAVID 02/07/2013, 5:05 PM    

## 2013-02-13 ENCOUNTER — Encounter (HOSPITAL_COMMUNITY): Payer: Self-pay | Admitting: General Surgery

## 2013-02-13 LAB — COMPREHENSIVE METABOLIC PANEL
AST: 26 U/L (ref 0–37)
Albumin: 3.6 g/dL (ref 3.5–5.2)
BUN: 10 mg/dL (ref 6–23)
Calcium: 8.7 mg/dL (ref 8.4–10.5)
Creatinine, Ser: 0.73 mg/dL (ref 0.50–1.35)
GFR calc non Af Amer: >90 mL/min (ref >90)

## 2013-02-13 LAB — CBC WITH DIFFERENTIAL/PLATELET
Basophils Absolute: 0 10*3/uL (ref 0.0–0.1)
Basophils Relative: 0 % (ref 0–1)
Eosinophils Relative: 0 % (ref 0–5)
HCT: 42.6 % (ref 39.0–52.0)
MCH: 29.5 pg (ref 26.0–34.0)
MCHC: 33.8 g/dL (ref 30.0–36.0)
MCV: 87.3 fL (ref 78.0–100.0)
Monocytes Absolute: 0.8 10*3/uL (ref 0.1–1.0)
RDW: 13.1 % (ref 11.5–15.5)

## 2013-02-13 MED ORDER — KETOROLAC TROMETHAMINE 30 MG/ML IJ SOLN
30.0000 mg | Freq: Four times a day (QID) | INTRAMUSCULAR | Status: DC | PRN
  Administered 2013-02-13 – 2013-02-14 (×2): 30 mg via INTRAVENOUS
  Filled 2013-02-13 (×2): qty 1

## 2013-02-13 NOTE — Care Management Note (Addendum)
    Page 1 of 1   02/13/2013     10:49:00 AM   CARE MANAGEMENT NOTE 02/13/2013  Patient:  Aaron Whitehead, Aaron Whitehead   Account Number:  [id number]  Date Initiated:  02/13/2013  Documentation initiated by:  Lorenda Ishihara  Subjective/Objective Assessment:   42 yo male admitted s/p sleeve gastrectomy. PTA lived at home with spouse.     Action/Plan:   Home when stable   Anticipated DC Date:  02/16/2013   Anticipated DC Plan:  HOME/SELF CARE      DC Planning Services  CM consult      Choice offered to / List presented to:             Status of service:  Completed, signed off Medicare Important Message given?   (If response is "NO", the following Medicare IM given date fields will be blank) Date Medicare IM given:   Date Additional Medicare IM given:    Discharge Disposition:  HOME/SELF CARE  Per UR Regulation:  Reviewed for med. necessity/level of care/duration of stay  If discussed at Long Length of Stay Meetings, dates discussed:    Comments:

## 2013-02-13 NOTE — Progress Notes (Signed)
1 Day Post-Op  Subjective: Had some nausea associated with the morphine, otherwise pain okay  Objective: Vital signs in last 24 hours: Temp:  [97.4 F (36.3 C)-98.2 F (36.8 C)] 97.9 F (36.6 C) (07/09 0510) Pulse Rate:  [72-97] 81 (07/09 0510) Resp:  [14-18] 16 (07/09 0510) BP: (128-152)/(73-95) 152/87 mmHg (07/09 0510) SpO2:  [93 %-100 %] 97 % (07/09 0510) Weight:  [321 lb (145.605 kg)-321 lb 1.6 oz (145.65 kg)] 321 lb 1.6 oz (145.65 kg) (07/08 1352) Last BM Date: 02/11/13  Intake/Output from previous day: 07/08 0701 - 07/09 0700 In: 3775 [I.V.:3775] Out: 1095 [Urine:900; Drains:95; Blood:100] Intake/Output this shift:    General appearance: alert, cooperative and no distress Resp: nonlabored Cardio: normal rate, regular GI: soft, appropriate incisional tenderness, ND, wounds without infection, JP ss Extremities: SCD's bilat  Lab Results:   Recent Labs  02/13/13 0532  WBC 12.0*  HGB 14.4  HCT 42.6  PLT 246   BMET  Recent Labs  02/13/13 0532  NA 137  K 3.9  CL 100  CO2 29  GLUCOSE 126*  BUN 10  CREATININE 0.73  CALCIUM 8.7   PT/INR No results found for this basename: LABPROT, INR,  in the last 72 hours ABG No results found for this basename: PHART, PCO2, PO2, HCO3,  in the last 72 hours  Studies/Results: No results found.  Anti-infectives: Anti-infectives   Start     Dose/Rate Route Frequency Ordered Stop   02/12/13 0600  ertapenem (INVANZ) 1 g in sodium chloride 0.9 % 50 mL IVPB     1 g 100 mL/hr over 30 Minutes Intravenous On call to O.R. 02/11/13 1828 02/12/13 1020      Assessment/Plan: s/p Procedure(s) with comments: LARAPROSCOPIC SLEEVE GASTRECTOMY WITH EGD (N/A) - LARAPROSCOPIC SLEEVE GASTRECTOMY WITH EGD ESOPHAGOGASTRODUODENOSCOPY (EGD) (N/A) He is doing okay.  He has had some nausea which he associates with the pain medication, otherwise no issues.  HR normal and HGB okay  LOS: 1 day    Lodema Pilot DAVID 02/13/2013

## 2013-02-13 NOTE — Anesthesia Postprocedure Evaluation (Signed)
Anesthesia Post Note  Patient: Aaron Whitehead  Procedure(s) Performed: Procedure(s) (LRB): LARAPROSCOPIC SLEEVE GASTRECTOMY WITH EGD (N/A) ESOPHAGOGASTRODUODENOSCOPY (EGD) (N/A)  Anesthesia type: General  Patient location: PACU  Post pain: Pain level controlled  Post assessment: Post-op Vital signs reviewed  Last Vitals:  Filed Vitals:   02/13/13 0510  BP: 152/87  Pulse: 81  Temp: 36.6 C  Resp: 16    Post vital signs: Reviewed  Level of consciousness: sedated  Complications: No apparent anesthesia complications

## 2013-02-14 MED ORDER — OXYCODONE-ACETAMINOPHEN 5-325 MG/5ML PO SOLN: 5.0000 mL | ORAL | Status: DC | PRN

## 2013-02-14 MED ORDER — ONDANSETRON 4 MG PO TBDP: 4.0000 mg | ORAL_TABLET | Freq: Three times a day (TID) | ORAL | Status: DC | PRN

## 2013-02-14 NOTE — Progress Notes (Signed)
Patient alert and oriented, pain is controlled. Patient is tolerating fluids, tolerating protein shake today. Reviewed Gastric sleeve discharge instructions with patient and spouse. Patient is able to articulate understanding. Offered information on BELT program as well as Support Group offerings at [employer].   GASTRIC BYPASS / SLEEVE  Home Care Instructions  These instructions are to help you care for yourself when you go home.  Call: If you have any problems.   Call [phone] and ask for the surgeon on call   If you need immediate assistance come to the ER at [facility]. Tell the ER staff that you are a new post-op gastric bypass or gastric sleeve patient   Signs and symptoms to report:   Severe vomiting or nausea o If you cannot handle clear liquids for longer than 1 day, call your surgeon    Abdominal pain which does not get better after taking your pain medication   Fever greater than 100.4 F and chills   Heart rate over 100 beats a minute   Trouble breathing   Chest pain    Redness, swelling, drainage, or foul odor at incision (surgical) sites    If your incisions open or pull apart   Swelling or pain in calf (lower leg)   Diarrhea (Loose bowel movements that happen often), frequent watery, uncontrolled bowel movements   Constipation, (no bowel movements for 3 days) if this happens:  o Take Milk of Magnesia, 2 tablespoons by mouth, 3 times a day for 2 days if needed o Stop taking Milk of Magnesia once you have had a bowel movement o Call your doctor if constipation continues Or o Take Miralax  (instead of Milk of Magnesia) following the label instructions o Stop taking Miralax once you have had a bowel movement o Call your doctor if constipation continues   Anything you think is "abnormal for you"   Normal side effects after surgery:   Unable to sleep at night or unable to concentrate   Irritability   Being tearful (crying) or depressed These are common complaints, possibly  related to your anesthesia, stress of surgery and change in lifestyle, that usually go away a few weeks after surgery.  If these feelings continue, call your medical doctor.  Wound Care: You may have surgical glue, steri-strips, or staples over your incisions after surgery   Surgical glue:  Looks like a clear film over your incisions and will wear off a little at a time   Steri-strips : Adhesive strips of tape over your incisions. You may notice a yellowish color on the skin under the steri-strips. This is used to make the   steri-strips stick better. Do not pull the steri-strips off - let them fall off   Staples: Staples may be removed before you leave the hospital o If you go home with staples, call [facility] at for an appointment with your surgeon's nurse to have staples removed 10 days after surgery, [phone]   Showering: You may shower two (2) days after your surgery unless your surgeon tells you differently o Wash gently around incisions with warm soapy water, rinse well, and gently pat dry  o If you have a drain (tube from your incision), you may need someone to hold this while you shower  o No tub baths until staples are removed and incisions are healed     Medications:   Medications should be liquid or crushed if larger than the size of a dime   Extended  release pills (medication that releases a little bit at a time through the day) should not be crushed   Depending on the size and number of medications you take, you may need to space (take a few throughout the day)/change the time you take your medications so that you do not over-fill your pouch (smaller stomach)   Make sure you follow-up with your primary care physician to make medication changes needed during rapid weight loss and life-style changes   If you have diabetes, follow up with the doctor that orders your diabetes medication(s) within one week after surgery and check your blood sugar regularly.   Do not  drive while taking narcotics (pain medications)   Do not take acetaminophen (Tylenol) and Roxicet or Lortab Elixir at the same time since these pain medications contain acetaminophen  Diet:                    First 2 Weeks  You will see the nutritionist about two (2) weeks after your surgery. The nutritionist will increase the types of foods you can eat if you are handling liquids well:   If you have severe vomiting or nausea and cannot handle clear liquids lasting longer than 1 day, call your surgeon  Protein Shake   Drink at least 2 ounces of shake 5-6 times per day   Each serving of protein shakes (usually 8 - 12 ounces) should have a minimum of:  o 15 grams of protein  o And no more than 5 grams of carbohydrate    Goal for protein each day: o Men = 80 grams per day o Women = 60 grams per day   Protein powder may be added to fluids such as non-fat milk or Lactaid milk or Soy milk (limit to 35 grams added protein powder per serving)  Hydration   Slowly increase the amount of water and other clear liquids as tolerated (See Acceptable Fluids)   Slowly increase the amount of protein shake as tolerated     Sip fluids slowly and throughout the day   May use sugar substitutes in small amounts (no more than 6 - 8 packets per day; i.e. Splenda)  Fluid Goal   The first goal is to drink at least 8 ounces of protein shake/drink per day (or as directed by the nutritionist); some examples of protein shakes are [employer], [employer], EAS Edge HP, and Unjury. See handout from pre-op Bariatric Education Class: o Slowly increase the amount of protein shake you drink as tolerated o You may find it easier to slowly sip shakes throughout the day o It is important to get your proteins in first   Your fluid goal is to drink 64 - 100 ounces of fluid daily o It may take a few weeks to build up to this   32 oz (or more) should be clear liquids  And    32 oz (or more) should be full liquids (see  below for examples)   Liquids should not contain sugar, caffeine, or carbonation  Clear Liquids:   Water or Sugar-free flavored water (i.e. Fruit H2O, Propel)   Decaffeinated coffee or tea (sugar-free)   Crystal Lite, Wyler's Lite, Minute Maid Lite   Sugar-free Jell-O   Bouillon or broth   Sugar-free Popsicle:   *Less than 20 calories each; Limit 1 per day  Full Liquids: Protein Shakes/Drinks + 2 choices per day of other full liquids   Full liquids must be: o No  More Than 12 grams of Carbs per serving  o No More Than 3 grams of Fat per serving   Strained low-fat cream soup   Non-Fat milk   Fat-free Lactaid Milk   Sugar-free yogurt (Dannon Lite & Fit, Greek yogurt)      Vitamins and Minerals   Start 1 day after surgery unless otherwise directed by your surgeon   2 Chewable Multivitamin / Multimineral Supplement with iron (i.e. Centrum for Adults)   Vitamin B-12, 350 - 500 micrograms sub-lingual (place tablet under the tongue) each day   Chewable Calcium Citrate with Vitamin D-3 (Example: 3 Chewable Calcium Plus 600 with Vitamin D-3) o Take 500 mg three (3) times a day for a total of 1500 mg each day o Do not take all 3 doses of calcium at one time as it may cause constipation, and you can only absorb 500 mg  at a time  o Do not mix multivitamins containing iron with calcium supplements; take 2 hours apart o Do not substitute Tums (calcium carbonate) for your calcium   Menstruating women and those at risk for anemia (a blood disease that causes weakness) may need extra iron o Talk with your doctor to see if you need more iron   If you need extra iron: Total daily Iron recommendation (including Vitamins) is 50 to 100 mg Iron/day   Do not stop taking or change any vitamins or minerals until you talk to your nutritionist or surgeon   Your nutritionist and/or surgeon must approve all vitamin and mineral supplements   Activity and Exercise: It is important to continue walking at home.   Limit your physical activity as instructed by your doctor.  During this time, use these guidelines:   Do not lift anything greater than ten (10) pounds for at least two (2) weeks   Do not go back to work or drive until Designer, industrial/product says you can   You may have sex when you feel comfortable  o It is VERY important for male patients to use a reliable birth control method; fertility often increases after surgery  o Do not get pregnant for at least 18 months   Start exercising as soon as your doctor tells you that you can o Make sure your doctor approves any physical activity   Start with a simple walking program   Walk 5-15 minutes each day, 7 days per week.    Slowly increase until you are walking 30-45 minutes per day Consider joining our BELT program. [phone] or email [email or web address]   Special Instructions Things to remember:   Free counseling is available for you and your family through collaboration between [facility] and [place]. Please call [phone] and leave a message   Use your CPAP when sleeping if this applies to you   [facility] has a free Bariatric Surgery Support Group that meets monthly, the 3rd Thursday, 6 pm, [facility] Classrooms You can see classes online at [email or web address]   It is very important to keep all follow up appointments with your surgeon, nutritionist, primary care physician, and behavioral health practitioner o After the first year, please follow up with your bariatric surgeon and nutritionist at least once a year in order to maintain best weight loss results [facility]: [phone] [facility]: [phone] Bariatric Nurse Coordinator: [phone]

## 2013-02-14 NOTE — Progress Notes (Signed)
2 Days Post-Op  Subjective: Continues to do well.  Ambulatory.  Tolerating liquids  Objective: Vital signs in last 24 hours: Temp:  [97.9 F (36.6 C)-98.6 F (37 C)] 97.9 F (36.6 C) (07/10 0545) Pulse Rate:  [70-79] 78 (07/10 0545) Resp:  [18-20] 18 (07/10 0545) BP: (134-153)/(68-90) 135/68 mmHg (07/10 0545) SpO2:  [97 %-100 %] 100 % (07/10 0545) Last BM Date: 02/11/13  Intake/Output from previous day: 07/09 0701 - 07/10 0700 In: 3405 [P.O.:480; I.V.:2925] Out: 4135 [Urine:4050; Drains:85] Intake/Output this shift:    General appearance: alert, cooperative and no distress Resp: clear to auscultation bilaterally Cardio: normal rate, regular GI: soft, minimal tenderness, ND, wounds without infection, JP ss  Lab Results:   Recent Labs  02/13/13 0532  WBC 12.0*  HGB 14.4  HCT 42.6  PLT 246   BMET  Recent Labs  02/13/13 0532  NA 137  K 3.9  CL 100  CO2 29  GLUCOSE 126*  BUN 10  CREATININE 0.73  CALCIUM 8.7   PT/INR No results found for this basename: LABPROT, INR,  in the last 72 hours ABG No results found for this basename: PHART, PCO2, PO2, HCO3,  in the last 72 hours  Studies/Results: No results found.  Anti-infectives: Anti-infectives   Start     Dose/Rate Route Frequency Ordered Stop   02/12/13 0600  ertapenem (INVANZ) 1 g in sodium chloride 0.9 % 50 mL IVPB     1 g 100 mL/hr over 30 Minutes Intravenous On call to O.R. 02/11/13 1828 02/12/13 1020      Assessment/Plan: s/p Procedure(s) with comments: LARAPROSCOPIC SLEEVE GASTRECTOMY WITH EGD (N/A) - LARAPROSCOPIC SLEEVE GASTRECTOMY WITH EGD ESOPHAGOGASTRODUODENOSCOPY (EGD) (N/A) Discharge He looks good and feels well.  should be okay for discharge to home  LOS: 2 days    Lodema Pilot DAVID 02/14/2013

## 2013-02-14 NOTE — Patient Instructions (Addendum)
Follow:   Pre-Op Diet per MD 2 weeks prior to surgery  Phase 2- Liquids (clear/full) 2 weeks after surgery  Vitamin/Mineral/Calcium guidelines for purchasing bariatric supplements  Exercise guidelines pre and post-op per MD  Follow-up at [facility] in 2 weeks post-op for diet advancement. Contact Jenney Brester as needed with questions/concerns. 

## 2013-02-14 NOTE — Patient Instructions (Signed)
Goals:  Eat 3 meals/day, Avoid meal skipping   Have lean, protein rich foods with all carbohydrates  Limit carbohydrate 2 servings/meal and 1 serving/snack  Choose more whole grains, lean protein, low-fat dairy, and fruits/non-starchy vegetables.   Aim for >30 min of physical activity daily  Limit sugar-sweetened beverages and concentrated sweets 

## 2013-02-19 ENCOUNTER — Encounter: Payer: Self-pay | Admitting: *Deleted

## 2013-02-19 ENCOUNTER — Encounter: Payer: BC Managed Care – PPO | Attending: General Surgery | Admitting: *Deleted

## 2013-02-19 VITALS — Ht 67.0 in | Wt 314.5 lb

## 2013-02-19 DIAGNOSIS — E669 Obesity, unspecified: Secondary | ICD-10-CM

## 2013-02-19 DIAGNOSIS — Z713 Dietary counseling and surveillance: Secondary | ICD-10-CM | POA: Insufficient documentation

## 2013-02-19 NOTE — Patient Instructions (Addendum)
Patient to follow Phase 3A-Soft, High Protein Diet and follow-up at [facility] in 6 weeks for 2 months post-op nutrition visit for diet advancement. 

## 2013-02-25 NOTE — Discharge Summary (Signed)
Physician Discharge Summary  Patient ID: Aaron Whitehead MRN: [id number] DOB/AGE: 1971/07/16 42 y.o.  Admit date: 02/12/2013 Discharge date: 02/25/2013  Admission Diagnoses: obesity  Discharge Diagnoses: same Active Problems:   * No active hospital problems. *   Discharged Condition: stable  Hospital Course: to OR 02/12/13 for lap sleeve gastrectomy/EGD.  No apparent complications.  HE had some nausea on POD 1 but this soon passed and diet started on POD 1.  He was tolerating this well and minimal pian.  HE was stable for discharge on POD 2.  Consults: None  Significant Diagnostic Studies: none  Treatments: surgery: 02/12/13 lap sleeve gastrectomy/EGD  Disposition: 01-Home or Self Care  Discharge Orders   Future Appointments Provider Department Dept Phone   02/27/2013 10:30 AM Wanda Plump, MD [facility]  [place] [phone]   03/14/2013 2:15 PM Lodema Pilot, DO [facility], [place] [phone]   04/03/2013 4:00 PM Orvil Feil Himmelrich, RD [facility] [phone]   12/16/2013 9:15 AM Waymon Budge, MD [facility] [phone]   Future Orders Complete By Expires     Call MD for:  difficulty breathing, headache or visual disturbances  As directed     Call MD for:  hives  As directed     Call MD for:  persistant dizziness or light-headedness  As directed     Call MD for:  persistant nausea and vomiting  As directed     Call MD for:  redness, tenderness, or signs of infection (pain, swelling, redness, odor or green/yellow discharge around incision site)  As directed     Call MD for:  severe uncontrolled pain  As directed     Call MD for:  temperature >100.4  As directed     Discharge instructions  As directed     Comments:      Call [phone] to schedule follow up appointment in 3 weeks. May shower tomorrow. Full liquid diet x1 week, then pureed diet x1 week, then soft diet x1 week, then gradually  advance to high protein, low fat, low carb diet as tolerated. May increase activity as tolerated. Crush all medications or use liquid medications x 4 weeks Follow up with your primary care doctor about your blood pressure medications and to monitor your blood pressure.    Increase activity slowly  As directed         Medication List    STOP taking these medications       ADVIL 200 MG tablet  Generic drug:  ibuprofen      TAKE these medications       cetirizine 10 MG tablet  Commonly known as:  ZYRTEC  Take 10 mg by mouth as needed for allergies.     felodipine 10 MG 24 hr tablet  Commonly known as:  PLENDIL  Take 10 mg by mouth every morning.     lidocaine 5 %  Commonly known as:  LIDODERM  Place 1 patch onto the skin daily as needed (For pain.). Remove & Discard patch within 12 hours or as directed by MD     losartan 50 MG tablet  Commonly known as:  COZAAR  Take 50 mg by mouth every morning.     ondansetron 4 MG disintegrating tablet  Commonly known as:  ZOFRAN ODT  Take 1 tablet (4 mg total) by mouth every 8 (eight) hours as needed for nausea.     oxyCODONE-acetaminophen 5-325 MG/5ML solution  Commonly  known as:  ROXICET  Take 5-7.5 mLs by mouth every 4 (four) hours as needed.         SignedLodema Pilot DAVID 02/25/2013, 12:08 PM

## 2013-02-26 ENCOUNTER — Ambulatory Visit: Payer: BC Managed Care – PPO

## 2013-02-27 ENCOUNTER — Encounter: Payer: Self-pay | Admitting: Internal Medicine

## 2013-02-27 ENCOUNTER — Ambulatory Visit (INDEPENDENT_AMBULATORY_CARE_PROVIDER_SITE_OTHER): Payer: BC Managed Care – PPO | Admitting: Internal Medicine

## 2013-02-27 VITALS — BP 145/98 | HR 80 | Temp 98.3°F | Ht 67.5 in | Wt 306.2 lb

## 2013-02-27 DIAGNOSIS — E669 Obesity, unspecified: Secondary | ICD-10-CM

## 2013-02-27 DIAGNOSIS — R202 Paresthesia of skin: Secondary | ICD-10-CM

## 2013-02-27 DIAGNOSIS — I10 Essential (primary) hypertension: Secondary | ICD-10-CM

## 2013-02-27 DIAGNOSIS — Z23 Encounter for immunization: Secondary | ICD-10-CM

## 2013-02-27 DIAGNOSIS — Z0000 Encounter for general adult medical examination without abnormal findings: Secondary | ICD-10-CM

## 2013-02-27 NOTE — Assessment & Plan Note (Signed)
Much improved

## 2013-02-27 NOTE — Patient Instructions (Addendum)
For back pain: Stretching, Tylenol as needed, oxycodone if necessary. Avoid Motrin, ibuprofen, Aleve or similar medications. Call if no better in few days. --- Check the  BP twice a day,  be sure it is between 110/60 and 140/85. If it is consistently higher or lower, let me know -- Next visit 4 months

## 2013-02-27 NOTE — Progress Notes (Signed)
  Subjective:    Patient ID: Aaron Whitehead, male    DOB: 08/02/1971, 42 y.o.   MRN: [id number]  HPI CPX BP 145/98, see a/p  Past Medical History  Diagnosis Date  . Hypertension 2007  . Obesity   . OSA (obstructive sleep apnea)     NPSG 03/09/10 AHI 99.9/hr, CPAP 21/AHI 0.9/hr  . Unspecified asthma(493.90) 11/07/2012   Past Surgical History  Procedure Laterality Date  . Carpal tunnel release      bilat  . Adenoidectomy    . Tonsillectomy    . Laparoscopic gastric sleeve resection N/A 02/12/2013    Procedure: LARAPROSCOPIC SLEEVE GASTRECTOMY WITH EGD;  Surgeon: Lodema Pilot, DO;  Location: [facility];  Service: General;  Laterality: N/A;  LARAPROSCOPIC SLEEVE GASTRECTOMY WITH EGD  . Esophagogastroduodenoscopy N/A 02/12/2013    Procedure: ESOPHAGOGASTRODUODENOSCOPY (EGD);  Surgeon: Lodema Pilot, DO;  Location: [facility];  Service: General;  Laterality: N/A;   History   Social History  . Marital Status: Married    Spouse Name: N/A    Number of Children: 4  . Years of Education: N/A   Occupational History  . 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 Smoking status: Former Smoker -- 0.50 packs/day for 8 years    Types: Cigarettes    Quit date: 08/08/1992  . Smokeless tobacco: Never Used  . Alcohol Use: Yes     Comment: nothing lately  . Drug Use: No  . Sexually Active: Not on file   Other Topics Concern  . Not on file   Social History Narrative   Divorced, remarried          Family History  Problem Relation Age of Onset  . Diabetes Other     GM  . Heart attack      GF  . Prostate cancer Neg Hx   . Colon cancer Neg Hx   . Hypertension Mother   . Coronary artery disease Father     had cardiomegaly +smoker     Review of Systems Diet-- started a new diet 4 weeks ago, s/p bariatric surgery 4 weeks ago, feels well, lost ~ 40 pounds 3 weeks history of back pain, on and off, could be left sided or  bilaterally, sometimes radiate to both legs  laterally up to the knee or ankle. Tylenol helps. Denies fever or chills. No bladder or bowel incontinence.No lower extremity paresthesias.  No chest pain, shortness or breath. No dysuria or gross hematuria. No anxiety or depression.    Objective:   Physical Exam BP 145/98  Pulse 80  Temp(Src) 98.3 F (36.8 C) (Oral)  Ht 5' 7.5" (1.715 m)  Wt 306 lb 3.2 oz (138.891 kg)  BMI 47.22 kg/m2  SpO2 96%  General -- alert, well-developed, NAD.   Neck --no thyromegaly Lungs -- normal respiratory effort, no intercostal retractions, no accessory muscle use, and normal breath sounds.   Heart-- normal rate, regular rhythm, no murmur, and no gallop.   Extremities-- trace  pretibial edema bilaterally Back-- no tender to palpation  Neurologic-- alert & oriented X3 , DTRs and  strength normal in all extremities. Gait-posture non antalgic, streight leg test wnl Psych-- Cognition and judgment appear intact. Alert and cooperative with normal attention span and concentration.  not anxious appearing and not depressed appearing.        Assessment & Plan:

## 2013-02-27 NOTE — Assessment & Plan Note (Signed)
After bariatric surgery 2 weeks ago was recommended to go back on Plendil -losartan, he did and noted generalized swelling and self discontinue medicines. BP today  145/98, has been checking his BP consistently at home and is 140/80, 140/75. Plan: Continue his healthier diet, stay active, check BP twice a day. Consider restart losartan (plendil is likely the culprit of edema)

## 2013-02-27 NOTE — Assessment & Plan Note (Signed)
Status post bariatric surgery 2 weeks ago, has lost 40 pounds , feels well.

## 2013-02-27 NOTE — Assessment & Plan Note (Addendum)
Tdap 2002 and today Never had a cscope all labs from last few months reviewed, no need for labs today. Doing great w/ diet exercise discussed Has low back pain w/o radicular sx, rec stretching-warm compress-tylenol. Has a left over oxycodone to use it as needed, call if no better .

## 2013-02-28 ENCOUNTER — Encounter: Payer: Self-pay | Admitting: Internal Medicine

## 2013-03-08 ENCOUNTER — Ambulatory Visit (INDEPENDENT_AMBULATORY_CARE_PROVIDER_SITE_OTHER): Payer: BC Managed Care – PPO | Admitting: General Surgery

## 2013-03-09 NOTE — Progress Notes (Addendum)
Bariatric Class:  Appt start time: 1600 end time:  1700.  2 Week Post-Operative Nutrition Class  Patient was seen on 02/19/13 for Post-Operative [facility].   Surgery date: 02/13/13 Surgery type: Sleeve Gastrectomy Start weight at [facility]: 356.4 lbs (10/19/12)  Weight today: 314.5 lbs Weight change: 16.5 lbs Total weight lost: 41.9 lbs (from start weight)  [facility]AN[facility] BODY COMP RESULTS   10/19/12  01/31/13 02/19/13  BMI (kg/m^2)  55.5  51.8 49.3  Fat Mass (lbs)  131.5  111.0 107.0  Fat Free Mass (lbs)  223.0  220.0 207.5  Total Body Water (lbs)  163.0  161.0 152.0   The following the learning objectives were met by the patient during this course:  Identifies Phase 3A (Soft, High Proteins) Dietary Goals and will begin from 2 weeks post-operatively to 2 months post-operatively  Identifies appropriate sources of fluids and proteins   States protein recommendations and appropriate sources post-operatively  Identifies the need for appropriate texture modifications, mastication, and bite sizes when consuming solids  Identifies appropriate multivitamin and calcium sources post-operatively  Describes the need for physical activity post-operatively and will follow MD recommendations  States when to call healthcare provider regarding medication questions or post-operative complications  Handouts given during class include:  Phase 3A: Soft, High Protein Diet Handout  Follow-Up Plan: Patient will follow-up at [facility] in 6 weeks for 2 months post-op nutrition visit for diet advancement per MD.

## 2013-03-14 ENCOUNTER — Ambulatory Visit (INDEPENDENT_AMBULATORY_CARE_PROVIDER_SITE_OTHER): Payer: BC Managed Care – PPO | Admitting: General Surgery

## 2013-03-14 ENCOUNTER — Encounter (INDEPENDENT_AMBULATORY_CARE_PROVIDER_SITE_OTHER): Payer: Self-pay | Admitting: General Surgery

## 2013-03-14 VITALS — BP 140/82 | HR 78 | Resp 16 | Ht 67.5 in | Wt 298.6 lb

## 2013-03-14 DIAGNOSIS — Z4889 Encounter for other specified surgical aftercare: Secondary | ICD-10-CM

## 2013-03-14 DIAGNOSIS — Z5189 Encounter for other specified aftercare: Secondary | ICD-10-CM

## 2013-03-14 NOTE — Progress Notes (Signed)
Subjective:     Patient ID: Aaron Whitehead, male   DOB: Feb 19, 1971, 42 y.o.   MRN: [id number]  HPI This patient follows up 4 weeks status post vertical sleeve gastrectomy for obesity. He is doing very well and has stopped his blood pressure medication and is no longer using his CPAP. He feels well and has no food intolerances. He denies any nausea vomiting or reflux. He is taking is vitamins and protein supplements and denies any abdominal pain. He really has no complaints and is very satisfied.  Review of Systems     Objective:   Physical Exam No distress and nontoxic-appearing His abdomen is soft nontender exam his incisions are healing nicely without sign of infection There is no evidence of any neurologic deficits    Assessment:     Status post vertical sleeve Gastrectomy-doing well He is doing very well from this procedure without any evidence of any postoperative complications. I recommended that he coordinate closely with his primary care physician before stopping any blood pressure medications or his CPAP machine. Overall he is doing very well with his weight loss. He can gradually increase his diet as tolerated and I did recommend that he increase his physical activity since he really hasn't been doing much exercise.    Plan:     Increase physical activity and continue with vitamin supplements and protein. followup in 2 months and we will check some nutrition labs at that time

## 2013-04-03 ENCOUNTER — Ambulatory Visit: Payer: BC Managed Care – PPO | Admitting: *Deleted

## 2013-04-17 ENCOUNTER — Other Ambulatory Visit: Payer: Self-pay | Admitting: Internal Medicine

## 2013-04-17 NOTE — Telephone Encounter (Signed)
rx refilled per protocol. DJR  

## 2013-05-15 ENCOUNTER — Encounter (INDEPENDENT_AMBULATORY_CARE_PROVIDER_SITE_OTHER): Payer: Self-pay

## 2013-05-15 ENCOUNTER — Encounter (INDEPENDENT_AMBULATORY_CARE_PROVIDER_SITE_OTHER): Payer: Self-pay | Admitting: General Surgery

## 2013-05-15 ENCOUNTER — Ambulatory Visit (INDEPENDENT_AMBULATORY_CARE_PROVIDER_SITE_OTHER): Payer: BC Managed Care – PPO | Admitting: General Surgery

## 2013-05-15 DIAGNOSIS — Z09 Encounter for follow-up examination after completed treatment for conditions other than malignant neoplasm: Secondary | ICD-10-CM

## 2013-05-15 DIAGNOSIS — K912 Postsurgical malabsorption, not elsewhere classified: Secondary | ICD-10-CM

## 2013-05-15 NOTE — Progress Notes (Signed)
Subjective:     Patient ID: Aaron Whitehead, male   DOB: 01-28-1971, 42 y.o.   MRN: [id number]  HPI This patient follows up 3 months status post vertical sleeve gastrectomy. He is doing well and has no complaints. He has lost about 56 pounds since his procedure the last 3 months. He is not exercising routinely. He is not usually 100 and is taking about 80 g of protein per day. He is a little bit constipated but this has improved. He is taking vitamins and staying hydrated and he is off his CPAP machine and off all of his blood pressure medications.  Review of Systems     Objective:   Physical Exam No distress and nontoxic-appearing His abdomen is soft and nontender exam his incisions are healing nicely without any sign of infection    Assessment:     Status post sleeve gastrectomy-doing well He is doing very well from this procedure without any evidence of postoperative complications. He would like to lose about another 80 pounds to get to his goal weight of about 190 pounds and I think that he can do this. I recommended that he increase his physical activity and perform a regular activities and also recommended that E. Decrease some of his protein intake with his protein supplements. He seems to be getting enough protein with his meals but I think that he could cut out some of his protein shakes which would decrease his caloric intake up by about 250 calories. Other than that I think he is doing very well. We will see him back in about 3 months and we will check some nutrition labs    Plan:     We will check some nutrition labs and he will call me back after his labs are done to get the results. Otherwise increase physical activity and follow up in 3 months

## 2013-07-08 ENCOUNTER — Ambulatory Visit (INDEPENDENT_AMBULATORY_CARE_PROVIDER_SITE_OTHER): Payer: BC Managed Care – PPO | Admitting: Internal Medicine

## 2013-07-08 ENCOUNTER — Encounter: Payer: Self-pay | Admitting: Internal Medicine

## 2013-07-08 VITALS — BP 129/79 | HR 81 | Temp 96.8°F | Ht 67.5 in | Wt 268.4 lb

## 2013-07-08 DIAGNOSIS — J0100 Acute maxillary sinusitis, unspecified: Secondary | ICD-10-CM

## 2013-07-08 DIAGNOSIS — J0110 Acute frontal sinusitis, unspecified: Secondary | ICD-10-CM

## 2013-07-08 MED ORDER — AMOXICILLIN 500 MG PO CAPS: 500.0000 mg | ORAL_CAPSULE | Freq: Three times a day (TID) | ORAL | Status: DC

## 2013-07-08 NOTE — Progress Notes (Signed)
   Subjective:    Patient ID: Aaron Whitehead, male    DOB: Oct 01, 1970, 42 y.o.   MRN: [id number]  HPI   Symptoms began 07/04/13 as itchy, runny  nasal congestion associated postnasal drainage. As of 11/29 he developed left frontal and left facial pain as well as pain in the teeth.  He's been using Afrin, Mucinex, Motrin, milligrams with only partial response   PMH of OSA with CPAP; resolution with T&A and weight loss. PMH smoker up to 0.5 ppd; quit 1996 . He did have asthma as a child this resolved in spite of x15 Review of Systems  He specifically denies nasal purulence, otic pain, or otic discharge  There is no associated cough or sputum production. He has no shortness of breath or wheezing  There's been no associated fever, chills, sweats     Objective:   Physical Exam General appearance:well nourished; no acute distress or increased work of breathing is present.  No  lymphadenopathy about the head, neck, or axilla noted.   Eyes: No conjunctival inflammation or lid edema is present.   Ears:  External ear exam shows no significant lesions or deformities.  Otoscopic examination reveals clear canals, tympanic membranes are intact bilaterally without bulging, retraction, inflammation or discharge. Slight vascular prominence L TM  Nose:  External nasal examination shows no deformity or inflammation. Nasal mucosa are dry without lesions or exudates. R septal deviation with some obstruction to airflow.   Oral exam: Dental hygiene is good; lips and gums are healthy appearing.There is no oropharyngeal erythema or exudate noted.   Neck:  No deformities,  masses, or tenderness noted.     Heart:  Normal rate and regular rhythm. S1 and S2 normal without gallop, murmur, click, rub or other extra sounds.   Lungs:Chest clear to auscultation; no wheezes, rhonchi,rales ,or rubs present.No increased work of breathing.    Extremities:  No cyanosis or clubbing  . Trace edema at the sock line         Assessment & Plan:  #1 left frontal and maxillary sinusitis  Plan: See orders and recommendations

## 2013-07-08 NOTE — Progress Notes (Signed)
Pre visit review using our clinic review tool, if applicable. No additional management support is needed unless otherwise documented below in the visit note. 

## 2013-07-08 NOTE — Patient Instructions (Signed)

## 2013-12-16 ENCOUNTER — Ambulatory Visit: Payer: BC Managed Care – PPO | Admitting: Internal Medicine

## 2014-06-10 ENCOUNTER — Telehealth: Payer: Self-pay | Admitting: Internal Medicine

## 2014-06-10 NOTE — Telephone Encounter (Signed)
If you can put two appointments together to make for his CPE that will be fine.

## 2014-06-10 NOTE — Telephone Encounter (Signed)
Caller name: Aaron Whitehead, Aaron Whitehead Relation to pt: self  Call back number: [phone]  Reason for call: pt would like Whitehead physical before 09/05/14 please advise

## 2014-06-17 ENCOUNTER — Ambulatory Visit (INDEPENDENT_AMBULATORY_CARE_PROVIDER_SITE_OTHER): Payer: BC Managed Care – PPO | Admitting: Internal Medicine

## 2014-06-17 ENCOUNTER — Encounter: Payer: Self-pay | Admitting: Internal Medicine

## 2014-06-17 VITALS — BP 167/93 | HR 85 | Temp 98.2°F | Wt 284.4 lb

## 2014-06-17 DIAGNOSIS — H6691 Otitis media, unspecified, right ear: Secondary | ICD-10-CM

## 2014-06-17 DIAGNOSIS — R002 Palpitations: Secondary | ICD-10-CM

## 2014-06-17 DIAGNOSIS — D72829 Elevated white blood cell count, unspecified: Secondary | ICD-10-CM

## 2014-06-17 DIAGNOSIS — I10 Essential (primary) hypertension: Secondary | ICD-10-CM

## 2014-06-17 LAB — CBC WITH DIFFERENTIAL/PLATELET
BASOS PCT: 0.4 % (ref 0.0–3.0)
Basophils Absolute: 0 10*3/uL (ref 0.0–0.1)
EOS ABS: 0.2 10*3/uL (ref 0.0–0.7)
Eosinophils Relative: 2.7 % (ref 0.0–5.0)
HEMATOCRIT: 45.2 % (ref 39.0–52.0)
Hemoglobin: 14.9 g/dL (ref 13.0–17.0)
LYMPHS ABS: 1.8 10*3/uL (ref 0.7–4.0)
Lymphocytes Relative: 22.2 % (ref 12.0–46.0)
MCHC: 33 g/dL (ref 30.0–36.0)
MCV: 89.7 fl (ref 78.0–100.0)
MONO ABS: 0.5 10*3/uL (ref 0.1–1.0)
Monocytes Relative: 6 % (ref 3.0–12.0)
NEUTROS PCT: 68.7 % (ref 43.0–77.0)
Neutro Abs: 5.5 10*3/uL (ref 1.4–7.7)
PLATELETS: 228 10*3/uL (ref 150.0–400.0)
RBC: 5.04 Mil/uL (ref 4.22–5.81)
RDW: 13.9 % (ref 11.5–15.5)
WBC: 8 10*3/uL (ref 4.0–10.5)

## 2014-06-17 LAB — BASIC METABOLIC PANEL
BUN: 18 mg/dL (ref 6–23)
CALCIUM: 8.4 mg/dL (ref 8.4–10.5)
CO2: 34 meq/L — AB (ref 19–32)
CREATININE: 0.8 mg/dL (ref 0.4–1.5)
Chloride: 105 mEq/L (ref 96–112)
GFR: 114.03 mL/min (ref >60.00)
GLUCOSE: 86 mg/dL (ref 70–99)
Potassium: 4.1 mEq/L (ref 3.5–5.1)
SODIUM: 139 meq/L (ref 135–145)

## 2014-06-17 LAB — TSH: TSH: 0.88 u[IU]/mL (ref 0.35–4.50)

## 2014-06-17 MED ORDER — LOSARTAN POTASSIUM-HCTZ 50-12.5 MG PO TABS: 1.0000 | ORAL_TABLET | Freq: Every day | ORAL | Status: DC

## 2014-06-17 MED ORDER — AMOXICILLIN 500 MG PO CAPS: 1000.0000 mg | ORAL_CAPSULE | Freq: Two times a day (BID) | ORAL | Status: DC

## 2014-06-17 NOTE — Progress Notes (Signed)
Subjective:    Patient ID: Aaron Whitehead, male    DOB: 09/29/1970, 43 y.o.   MRN: [id number]  DOS:  06/17/2014 Type of visit - description : acute, several issues Interval history: History of hypertension, off medications after bariatric surgery however 6 weeks ago noted his BP was elevated in the 160/90 range. He started to watch his diet and increase his exercise, was able to lose some weight however the  BP remains elevated.  Also 2 weeks history of occasional palpitation, described as his heart skipping. No associated syncope or presyncopal feeling. Denies taking any OTC weight loss medicine.  Also few days history of a cold: Cough, nasal congestion, had some fever today. Very little sputum yellow in color   ROS Denies chest pain or difficulty breathing. Mild lower extremity  edema, slightly worse than baseline? No anxiety-depression No dizziness No recent prolonged car trips or airplane trips  Past Medical History  Diagnosis Date  . Hypertension 2007  . Obesity   . OSA (obstructive sleep apnea)     NPSG 03/09/10 AHI 99.9/hr, CPAP 21/AHI 0.9/hr  . Unspecified asthma(493.90) 11/07/2012  . Asthma     childhood only    Past Surgical History  Procedure Laterality Date  . Carpal tunnel release      bilat  . Adenoidectomy    . Tonsillectomy    . Laparoscopic gastric sleeve resection N/A 02/12/2013    Procedure: LARAPROSCOPIC SLEEVE GASTRECTOMY WITH EGD;  Surgeon: Lodema PilotBrian Layton, DO;  Location: [facility];  Service: General;  Laterality: N/A;  LARAPROSCOPIC SLEEVE GASTRECTOMY WITH EGD  . Esophagogastroduodenoscopy N/A 02/12/2013    Procedure: ESOPHAGOGASTRODUODENOSCOPY (EGD);  Surgeon: Lodema PilotBrian Layton, DO;  Location: [facility];  Service: General;  Laterality: N/A;    History   Social History  . Marital Status: Married    Spouse Name: N/A    Number of Children: 4  . Years of Education: N/A   Occupational History  . Engineer     chips for cell [employer]    Social History Main Topics    . Smoking status: Former Smoker -- 0.50 packs/day for 8 years    Types: Cigarettes    Quit date: 08/08/1992  . Smokeless tobacco: Never Used     Comment: smoked 1988-1996, up to 1/2 ppd.   . Alcohol Use: Yes     Comment: nothing lately  . Drug Use: No  . Sexual Activity: Not on file   Other Topics Concern  . Not on file   Social History Narrative   Divorced, remarried               Medication List       This list is accurate as of: 06/17/14  6:46 PM.  Always use your most recent med list.               amoxicillin 500 MG capsule  Commonly known as:  AMOXIL  Take 2 capsules (1,000 mg total) by mouth 2 (two) times daily.     cetirizine 10 MG tablet  Commonly known as:  ZYRTEC  Take 10 mg by mouth as needed for allergies.     fluticasone 50 MCG/ACT nasal spray  Commonly known as:  FLONASE  INHALE 2 SPRAYS EACH NOSTRIL ONCE DAILY     losartan-hydrochlorothiazide 50-12.5 MG per tablet  Commonly known as:  HYZAAR  Take 1 tablet by mouth daily.     MULTIVITAMIN PO  Take 1 tablet by mouth daily.  Objective:   Physical Exam BP 167/93 mmHg  Pulse 85  Temp(Src) 98.2 F (36.8 C) (Oral)  Wt 284 lb 6 oz (128.992 kg)  SpO2 97%   General -- alert, well-developed, NAD.  Neck --no thyromegaly  HEENT-- Not pale.  R Ear-- TM slt bulge-red, no d/c L ear-- normal Throat symmetric, no redness or discharge. Face symmetric, sinuses not tender to palpation. Nose congested. Lungs -- normal respiratory effort, no intercostal retractions, no accessory muscle use, and normal breath sounds.  Heart-- normal rate, regular rhythm, no murmur.   Extremities-- trace  pretibial edema bilaterally  Neurologic--  alert & oriented X3. Speech normal, gait appropriate for age, strength symmetric and appropriate for age.  Psych-- Cognition and judgment appear intact. Cooperative with normal attention span and concentration. No anxious or depressed appearing.      Assessment &  Plan:   URI-OMA, see instructions  Palpitations, check labs. No red flag symptoms, EKG: Sinus  . Plan: Observation, will call if symptoms increase

## 2014-06-17 NOTE — Assessment & Plan Note (Signed)
BP increasing over the last [redacted] weeks along with weight gain. Plan: BMP, CBC, restart meds  losartan HCT, diet and exercise discussed

## 2014-06-17 NOTE — Progress Notes (Signed)
Pre visit review using our clinic review tool, if applicable. No additional management support is needed unless otherwise documented below in the visit note. 

## 2014-06-17 NOTE — Patient Instructions (Signed)
Get your labs before you leave  Start losartan HCT  Check the  blood pressure 2 or 3 times a  Week  Be sure your blood pressure is between  145/85  and 110/65.  if it is consistently higher or lower, let me know   Rest, fluids , tylenol If  cough, take Mucinex DM twice a day as needed  If nasal  congestion use OTC Nasocort: 2 nasal sprays on each side of the nose daily until you feel better   Take the antibiotic as prescribed  (Amoxicillin) Call if not gradually better over the next  10 days Call anytime if the symptoms are severe

## 2014-06-18 NOTE — Addendum Note (Signed)
Addended by: Dorette GrateFAULKNER, Julieann Drummonds C on: 06/18/2014 03:05 PM   Modules accepted: Orders

## 2014-06-24 ENCOUNTER — Telehealth: Payer: Self-pay | Admitting: Hematology & Oncology

## 2014-06-24 NOTE — Telephone Encounter (Signed)
I spoke w NEW PATIENT today to remind them of their appointment with Dr. Ennever. Also, advised them to bring all medication bottles and insurance card information. ° °

## 2014-06-25 ENCOUNTER — Ambulatory Visit: Payer: BC Managed Care – PPO

## 2014-06-25 ENCOUNTER — Other Ambulatory Visit (HOSPITAL_BASED_OUTPATIENT_CLINIC_OR_DEPARTMENT_OTHER): Payer: BC Managed Care – PPO | Admitting: Lab

## 2014-06-25 ENCOUNTER — Encounter: Payer: Self-pay | Admitting: Family

## 2014-06-25 ENCOUNTER — Ambulatory Visit (HOSPITAL_BASED_OUTPATIENT_CLINIC_OR_DEPARTMENT_OTHER): Payer: BC Managed Care – PPO | Admitting: Family

## 2014-06-25 VITALS — BP 145/92 | HR 78 | Temp 97.9°F | Resp 18 | Ht 67.0 in | Wt 280.0 lb

## 2014-06-25 DIAGNOSIS — I10 Essential (primary) hypertension: Secondary | ICD-10-CM

## 2014-06-25 DIAGNOSIS — D72829 Elevated white blood cell count, unspecified: Secondary | ICD-10-CM

## 2014-06-25 LAB — CHCC SATELLITE - SMEAR

## 2014-06-25 LAB — CBC WITH DIFFERENTIAL (CANCER CENTER ONLY)
BASO#: 0.1 10*3/uL (ref 0.0–0.2)
BASO%: 0.6 % (ref 0.0–2.0)
EOS%: 1.3 % (ref 0.0–7.0)
Eosinophils Absolute: 0.1 10*3/uL (ref 0.0–0.5)
HCT: 48.4 % (ref 38.7–49.9)
HEMOGLOBIN: 16.4 g/dL (ref 13.0–17.1)
LYMPH#: 2 10*3/uL (ref 0.9–3.3)
LYMPH%: 24.2 % (ref 14.0–48.0)
MCH: 30.3 pg (ref 28.0–33.4)
MCHC: 33.9 g/dL (ref 32.0–35.9)
MCV: 90 fL (ref 82–98)
MONO#: 0.6 10*3/uL (ref 0.1–0.9)
MONO%: 6.8 % (ref 0.0–13.0)
NEUT%: 67.1 % (ref 40.0–80.0)
NEUTROS ABS: 5.7 10*3/uL (ref 1.5–6.5)
PLATELETS: 253 10*3/uL (ref 145–400)
RBC: 5.41 10*6/uL (ref 4.20–5.70)
RDW: 13.5 % (ref 11.1–15.7)
WBC: 8.4 10*3/uL (ref 4.0–10.0)

## 2014-06-25 NOTE — Progress Notes (Signed)
Hematology/Oncology Consultation   Name: Jennette KettleGregory A Cease      MRN: [id number]    Location: Room/bed info not found  Date: 06/25/2014 Time:11:41 AM   REFERRING PHYSICIAN:  Jose Paz  REASON FOR CONSULT:  Leukocytosis   DIAGNOSIS:  Benign leukocytosis, ear infection treated with amoxicillin   HISTORY OF PRESENT ILLNESS:  Mr. Knute NeuCouch is a very pleasant 43 yo male. He recently had an elevated WBC (12.0) during a routine visit with his PCP. He had an ear infection at the time and was put on Amoxicillin. He is feeling better now. His WBC today is 8.4.  He denies fever, chills, n/v, cough, rash, headache, dizziness, SOB, chest pain, palpitations, abdominal pain, constipation, diarrhea, blood in urine or stool. No bleeding or pain.  No tenderness, numbness or tingling in his extremities. He has chronic swelling in his lower legs.  His appetite is good and he drinks plenty of water. His weight is stable. He has high BP and was having palpitations with exercise. His EKG was normal and he was put on Hyzaar. This has helped not only with his BP but also with the swelling in his legs. Has has not had any more palpitations.  He has no personal or familial history of cancer, bleeding disorders or blood clots.  His mother is anemic and takes iron. He quit smoking years ago.  He has no problems with his thyroid.  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  Overall, he seems to be doing quite well.    ROS: All other 10 point review of systems is negative.   PAST MEDICAL HISTORY:   Past Medical History  Diagnosis Date  . Hypertension 2007  . Obesity   . OSA (obstructive sleep apnea)     NPSG 03/09/10 AHI 99.9/hr, CPAP 21/AHI 0.9/hr  . Unspecified asthma(493.90) 11/07/2012  . Asthma     childhood only    ALLERGIES: No Known Allergies    MEDICATIONS:  Current Outpatient Prescriptions on File Prior to Visit  Medication Sig Dispense Refill  . losartan-hydrochlorothiazide  (HYZAAR) 50-12.5 MG per tablet Take 1 tablet by mouth daily. 30 tablet 1  . Multiple Vitamins-Minerals (MULTIVITAMIN PO) Take 1 tablet by mouth daily.     No current facility-administered medications on file prior to visit.     PAST SURGICAL HISTORY Past Surgical History  Procedure Laterality Date  . Carpal tunnel release      bilat  . Adenoidectomy    . Tonsillectomy    . Laparoscopic gastric sleeve resection N/A 02/12/2013    Procedure: LARAPROSCOPIC SLEEVE GASTRECTOMY WITH EGD;  Surgeon: Lodema PilotBrian Layton, DO;  Location: [facility];  Service: General;  Laterality: N/A;  LARAPROSCOPIC SLEEVE GASTRECTOMY WITH EGD  . Esophagogastroduodenoscopy N/A 02/12/2013    Procedure: ESOPHAGOGASTRODUODENOSCOPY (EGD);  Surgeon: Lodema PilotBrian Layton, DO;  Location: [facility];  Service: General;  Laterality: N/A;    FAMILY HISTORY: Family History  Problem Relation Age of Onset  . Diabetes Other     GM  . Heart attack      GF  . Prostate cancer Neg Hx   . Colon cancer Neg Hx   . COPD Neg Hx   . Hypertension Mother   . Asthma Mother   . Coronary artery disease Father     had cardiomegaly +smoker     SOCIAL HISTORY:  reports that he quit smoking about 21 years ago. His smoking use included Cigarettes. He has a 4 pack-year smoking  history. He has never used smokeless tobacco. He reports that he drinks alcohol. He reports that he does not use illicit drugs.  PERFORMANCE STATUS: The patient's performance status is 0 - Asymptomatic  PHYSICAL EXAM: Most Recent Vital Signs: Blood pressure 145/92, pulse 78, temperature 97.9 F (36.6 C), resp. rate 18, height 5\' 7"  (1.702 m), weight 280 lb (127.007 kg). BP 145/92 mmHg  Pulse 78  Temp(Src) 97.9 F (36.6 C)  Resp 18  Ht 5\' 7"  (1.702 m)  Wt 280 lb (127.007 kg)  BMI 43.84 kg/m2  General Appearance:    Alert, cooperative, no distress, appears stated age  Head:    Normocephalic, without obvious abnormality, atraumatic  Eyes:    PERRL, conjunctiva/corneas clear, EOM's  intact, fundi    benign, both eyes             Throat:   Lips, mucosa, and tongue normal; teeth and gums normal  Neck:   Supple, symmetrical, trachea midline, no adenopathy;       thyroid:  No enlargement/tenderness/nodules; no carotid   bruit or JVD  Back:     Symmetric, no curvature, ROM normal, no CVA tenderness  Lungs:     Clear to auscultation bilaterally, respirations unlabored  Chest wall:    No tenderness or deformity  Heart:    Regular rate and rhythm, S1 and S2 normal, no murmur, rub   or gallop  Abdomen:     Soft, non-tender, bowel sounds active all four quadrants,    no masses, no organomegaly        Extremities:   Extremities normal, atraumatic, no cyanosis or edema  Pulses:   2+ and symmetric all extremities  Skin:   Skin color, texture, turgor normal, no rashes or lesions  Lymph nodes:   Cervical, supraclavicular, and axillary nodes normal  Neurologic:   CNII-XII intact. Normal strength, sensation and reflexes      throughout    LABORATORY DATA:  Results for orders placed or performed in visit on 06/25/14 (from the past 48 hour(s))  CBC with Differential [facility])     Status: None   Collection Time: 06/25/14 10:38 AM  Result Value Ref Range   WBC 8.4 4.0 - 10.0 10e3/uL   RBC 5.41 4.20 - 5.70 10e6/uL   HGB 16.4 13.0 - 17.1 g/dL   HCT 16.1 09.6 - 04.5 %   MCV 90 82 - 98 fL   MCH 30.3 28.0 - 33.4 pg   MCHC 33.9 32.0 - 35.9 g/dL   RDW 40.9 81.1 - 91.4 %   Platelets 253 145 - 400 10e3/uL   NEUT# 5.7 1.5 - 6.5 10e3/uL   LYMPH# 2.0 0.9 - 3.3 10e3/uL   MONO# 0.6 0.1 - 0.9 10e3/uL   Eosinophils Absolute 0.1 0.0 - 0.5 10e3/uL   BASO# 0.1 0.0 - 0.2 10e3/uL   NEUT% 67.1 40.0 - 80.0 %   LYMPH% 24.2 14.0 - 48.0 %   MONO% 6.8 0.0 - 13.0 %   EOS% 1.3 0.0 - 7.0 %   BASO% 0.6 0.0 - 2.0 %  [facility] - Smear     Status: None   Collection Time: 06/25/14 10:38 AM  Result Value Ref Range   Smear Result Smear Available       RADIOGRAPHY: No results found.      PATHOLOGY: None   ASSESSMENT/PLAN: Mr. Reier is a very pleasant 43 yo male. He recently had an elevated WBC (12.0) during a routine visit with his PCP.  He had an ear infection at the time and was put on Amoxicillin. He is feeling better now. His WBC today is 8.4.  His main issues appears to be high BP with leg swelling which have both improved since he started taking Hyzaar last week.  I do not feel that we need to make a follow-up appointment with him at this time.  All questions were answered. He knows to call the clinic with any problems, questions or concerns. We can certainly see in the future if he needs us.  The patient was discussed with Dr. Myna HidalgoEnnever and he is in agreement with the aforementioned.   [facility],Alecxander Mainwaring M

## 2014-07-11 ENCOUNTER — Encounter: Payer: BC Managed Care – PPO | Admitting: Internal Medicine

## 2014-07-23 ENCOUNTER — Telehealth: Payer: Self-pay | Admitting: Internal Medicine

## 2014-07-23 NOTE — Telephone Encounter (Signed)
Please call Pt, and schedule F/U appt with Dr. Drue NovelPaz. Okay to overbook.

## 2014-07-23 NOTE — Telephone Encounter (Signed)
Appointment scheduled for 07/30/14

## 2014-07-23 NOTE — Telephone Encounter (Signed)
I had to cancel the appointment for earlier this month, please arrange office visit for this month. Overbook  okay

## 2014-07-30 ENCOUNTER — Ambulatory Visit (INDEPENDENT_AMBULATORY_CARE_PROVIDER_SITE_OTHER): Payer: BC Managed Care – PPO | Admitting: Internal Medicine

## 2014-07-30 ENCOUNTER — Encounter: Payer: Self-pay | Admitting: Internal Medicine

## 2014-07-30 VITALS — BP 128/74 | HR 85 | Temp 97.7°F | Ht 67.0 in | Wt 288.5 lb

## 2014-07-30 DIAGNOSIS — I10 Essential (primary) hypertension: Secondary | ICD-10-CM

## 2014-07-30 DIAGNOSIS — R202 Paresthesia of skin: Secondary | ICD-10-CM

## 2014-07-30 DIAGNOSIS — Z0000 Encounter for general adult medical examination without abnormal findings: Secondary | ICD-10-CM

## 2014-07-30 LAB — BASIC METABOLIC PANEL
BUN: 16 mg/dL (ref 6–23)
CALCIUM: 8.9 mg/dL (ref 8.4–10.5)
CO2: 28 mEq/L (ref 19–32)
Chloride: 104 mEq/L (ref 96–112)
Creatinine, Ser: 0.7 mg/dL (ref 0.4–1.5)
GFR: 125 mL/min (ref >60.00)
GLUCOSE: 80 mg/dL (ref 70–99)
POTASSIUM: 3.7 meq/L (ref 3.5–5.1)
SODIUM: 139 meq/L (ref 135–145)

## 2014-07-30 LAB — LIPID PANEL
CHOLESTEROL: 211 mg/dL — AB (ref 0–200)
HDL: 37.3 mg/dL — ABNORMAL LOW (ref >39.00)
LDL CALC: 143 mg/dL — AB (ref 0–99)
NonHDL: 173.7
TRIGLYCERIDES: 156 mg/dL — AB (ref 0.0–149.0)
Total CHOL/HDL Ratio: 6
VLDL: 31.2 mg/dL (ref 0.0–40.0)

## 2014-07-30 LAB — ALT: ALT: 22 U/L (ref 0–53)

## 2014-07-30 LAB — AST: AST: 19 U/L (ref 0–37)

## 2014-07-30 MED ORDER — CYCLOBENZAPRINE HCL 10 MG PO TABS: 10.0000 mg | ORAL_TABLET | Freq: Every evening | ORAL | Status: DC | PRN

## 2014-07-30 MED ORDER — LOSARTAN POTASSIUM-HCTZ 50-12.5 MG PO TABS: 1.0000 | ORAL_TABLET | Freq: Every day | ORAL | Status: AC

## 2014-07-30 MED ORDER — PREDNISONE 10 MG PO TABS: ORAL_TABLET | ORAL | Status: DC

## 2014-07-30 NOTE — Progress Notes (Signed)
Pre visit review using our clinic review tool, if applicable. No additional management support is needed unless otherwise documented below in the visit note. 

## 2014-07-30 NOTE — Assessment & Plan Note (Addendum)
Good compliance w/ medication, ambulatory BPs excellent at 130/80, no apparent side effects, previously has lower extremity edema which is decreased. Continue with present care, leg elevation, low-salt diet and consider compression stockings to help with edema

## 2014-07-30 NOTE — Assessment & Plan Note (Signed)
Tdap 2014 Declined a flu shot  Never had a cscope all labs from last few months reviewed, check a BMP, LFTs and FLP   Diet-- avoid excessive fats-CHO Exercise-- just getting back on a routine

## 2014-07-30 NOTE — Assessment & Plan Note (Addendum)
Today he complains of pain of the right trapezoid with radiation to the right arm in a C6 radicular  fashion. Plan: Short-term prednisone, Flexeril, if not better will call for other referral

## 2014-07-30 NOTE — Progress Notes (Signed)
Subjective:    Patient ID: Aaron Whitehead, male    DOB: 1971-04-05, 43 y.o.   MRN: [id number]  DOS:  07/30/2014 Type of visit - description : cpx Interval history: In general feeling well, good compliance with medications for BP, lower extremity edema decreased compared to previous months. No apparent side effects.  Reports a right trapezoid discomfort like a knot, associated with tingling of the right arm. No neck pain or motor deficits. Wonders about a pinched nerve  ROS No chest pain or difficulty breathing. Had palpitations, see last office visit, symptoms decreased. Not dizzy No nausea, vomiting, diarrhea or blood in the stools No cough, sputum production or wheezing. No dysuria, gross hematuria difficulty urinating.   Past Medical History  Diagnosis Date  . Hypertension 2007  . Obesity   . OSA (obstructive sleep apnea)     NPSG 03/09/10 AHI 99.9/hr, CPAP 21/AHI 0.9/hr  . Unspecified asthma(493.90) 11/07/2012    Past Surgical History  Procedure Laterality Date  . Carpal tunnel release      bilat  . Adenoidectomy    . Tonsillectomy    . Laparoscopic gastric sleeve resection N/A 02/12/2013    Procedure: LARAPROSCOPIC SLEEVE GASTRECTOMY WITH EGD;  Surgeon: Lodema PilotBrian Layton, DO;  Location: [facility];  Service: General;  Laterality: N/A;  LARAPROSCOPIC SLEEVE GASTRECTOMY WITH EGD  . Esophagogastroduodenoscopy N/A 02/12/2013    Procedure: ESOPHAGOGASTRODUODENOSCOPY (EGD);  Surgeon: Lodema PilotBrian Layton, DO;  Location: [facility];  Service: General;  Laterality: N/A;    History   Social History  . Marital Status: Married    Spouse Name: N/A    Number of Children: 4  . Years of Education: N/A   Occupational History  . 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 Smoking status: Former Smoker -- 0.50 packs/day for 8 years    Types: Cigarettes    Quit date: 08/08/1992  . Smokeless tobacco: Never Used     Comment: smoked 1988-1996, up to 1/2 ppd.   . Alcohol Use: 0.0  oz/week    0 Not specified per week     Comment: rare   . Drug Use: No  . Sexual Activity: Not on file   Other Topics Concern  . Not on file   Social History Narrative   Divorced, remarried , wife pregnant           Family History  Problem Relation Age of Onset  . Diabetes Other     GM  . Heart attack      GF  . Prostate cancer Neg Hx   . Colon cancer Neg Hx   . COPD Neg Hx   . Hypertension Mother   . Asthma Mother   . Coronary artery disease Father     CAD ~ age 45, had cardiomegaly +smoker        Medication List       This list is accurate as of: 07/30/14 11:59 PM.  Always use your most recent med list.               cyclobenzaprine 10 MG tablet  Commonly known as:  FLEXERIL  Take 1 tablet (10 mg total) by mouth at bedtime as needed for muscle spasms.     losartan-hydrochlorothiazide 50-12.5 MG per tablet  Commonly known as:  HYZAAR  Take 1 tablet by mouth daily.     MULTIVITAMIN PO  Take 1 tablet by mouth daily.  predniSONE 10 MG tablet  Commonly known as:  DELTASONE  4 tablets x 2 days, 3 tabs x 2 days, 2 tabs x 2 days, 1 tab x 2 days           Objective:   Physical Exam BP 128/74 mmHg  Pulse 85  Temp(Src) 97.7 F (36.5 C) (Oral)  Ht 5\' 7"  (1.702 m)  Wt 288 lb 8 oz (130.863 kg)  BMI 45.17 kg/m2  SpO2 97% General -- alert, well-developed, NAD.  Neck --FROM, no TTP  Lungs -- normal respiratory effort, no intercostal retractions, no accessory muscle use, and normal breath sounds.  Heart-- normal rate, regular rhythm, no murmur.  Abdomen-- Not distended, good bowel sounds,soft, non-tender.  Extremities-- +/+++ pretibial edema bilaterally  Neurologic--  alert & oriented X3. Speech normal, gait appropriate for age, strength symmetric and appropriate for age.  DTRs symmetric. Psych-- Cognition and judgment appear intact. Cooperative with normal attention span and concentration. No anxious or depressed appearing.       Assessment & Plan:

## 2014-07-30 NOTE — Patient Instructions (Signed)
Get your blood work before you leave    Take prednisone as prescribed Flexeril, a muscle relaxant at night Tylenol as needed If the pain at the right upper back and tingling are not improving please call me for a referral.   Please come back to the office in 6 months for a routine check up  No fasting

## 2014-08-04 ENCOUNTER — Other Ambulatory Visit: Payer: Self-pay

## 2014-08-04 DIAGNOSIS — Z0000 Encounter for general adult medical examination without abnormal findings: Secondary | ICD-10-CM

## 2014-08-07 ENCOUNTER — Other Ambulatory Visit (INDEPENDENT_AMBULATORY_CARE_PROVIDER_SITE_OTHER): Payer: BC Managed Care – PPO

## 2014-08-07 DIAGNOSIS — Z0000 Encounter for general adult medical examination without abnormal findings: Secondary | ICD-10-CM

## 2014-08-07 LAB — HEMOGLOBIN A1C: Hgb A1c MFr Bld: 5.5 % (ref 4.6–6.5)

## 2014-09-05 ENCOUNTER — Encounter: Payer: BC Managed Care – PPO | Admitting: Internal Medicine

## 2014-10-23 ENCOUNTER — Ambulatory Visit (INDEPENDENT_AMBULATORY_CARE_PROVIDER_SITE_OTHER): Payer: BLUE CROSS/BLUE SHIELD | Admitting: Internal Medicine

## 2014-10-23 ENCOUNTER — Encounter: Payer: Self-pay | Admitting: Internal Medicine

## 2014-10-23 VITALS — BP 142/82 | HR 109 | Temp 98.9°F | Wt 302.2 lb

## 2014-10-23 DIAGNOSIS — B349 Viral infection, unspecified: Secondary | ICD-10-CM

## 2014-10-23 DIAGNOSIS — J020 Streptococcal pharyngitis: Secondary | ICD-10-CM

## 2014-10-23 LAB — POCT RAPID STREP A (OFFICE): RAPID STREP A SCREEN: NEGATIVE

## 2014-10-23 LAB — POCT INFLUENZA A/B
INFLUENZA A, POC: NEGATIVE
INFLUENZA B, POC: NEGATIVE

## 2014-10-23 MED ORDER — AZITHROMYCIN 250 MG PO TABS: ORAL_TABLET | ORAL | Status: DC

## 2014-10-23 MED ORDER — HYDROCODONE-HOMATROPINE 5-1.5 MG/5ML PO SYRP: 5.0000 mL | ORAL_SOLUTION | Freq: Four times a day (QID) | ORAL | Status: DC | PRN

## 2014-10-23 NOTE — Progress Notes (Signed)
Subjective:    Patient ID: Aaron Whitehead, male    DOB: 06/09/1971, 44 y.o.   MRN: [id number]  DOS:  10/23/2014 Type of visit - description : acute, here w/ wife Interval history: Symptoms started a week ago with chest congestion, sore throat, cough, unable to sleep due to cough, ear pain worse on the left. Taking Advil, Mucinex D.  Review of Systems  No fever or chills but he has cold sweats sometimes. Anterior upper chest pain with cough, mild shortness of breath. + Fatigue, no rash. He has generalized aches, less intense this week. Mild headaches  Mild dizziness with cough.  Past Medical History  Diagnosis Date  . Hypertension 2007  . Obesity   . OSA (obstructive sleep apnea)     NPSG 03/09/10 AHI 99.9/hr, CPAP 21/AHI 0.9/hr  . Unspecified asthma(493.90) 11/07/2012    Past Surgical History  Procedure Laterality Date  . Carpal tunnel release      bilat  . Adenoidectomy    . Tonsillectomy    . Laparoscopic gastric sleeve resection N/A 02/12/2013    Procedure: LARAPROSCOPIC SLEEVE GASTRECTOMY WITH EGD;  Surgeon: Lodema PilotBrian Layton, DO;  Location: [facility];  Service: General;  Laterality: N/A;  LARAPROSCOPIC SLEEVE GASTRECTOMY WITH EGD  . Esophagogastroduodenoscopy N/A 02/12/2013    Procedure: ESOPHAGOGASTRODUODENOSCOPY (EGD);  Surgeon: Lodema PilotBrian Layton, DO;  Location: [facility];  Service: General;  Laterality: N/A;    History   Social History  . Marital Status: Married    Spouse Name: N/A  . Number of Children: 4  . Years of Education: N/A   Occupational History  . 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 Smoking status: Former Smoker -- 0.50 packs/day for 8 years    Types: Cigarettes    Quit date: 08/08/1992  . Smokeless tobacco: Never Used     Comment: smoked 1988-1996, up to 1/2 ppd.   . Alcohol Use: 0.0 oz/week    0 Standard drinks or equivalent per week     Comment: rare   . Drug Use: No  . Sexual Activity: Not on file   Other Topics Concern    . Not on file   Social History Narrative   Divorced, remarried , wife pregnant              Medication List       This list is accurate as of: 10/23/14 11:59 PM.  Always use your most recent med list.               azithromycin 250 MG tablet  Commonly known as:  ZITHROMAX Z-PAK  2 tabs a day the first day, then 1 tab a day x 4 days     cyclobenzaprine 10 MG tablet  Commonly known as:  FLEXERIL  Take 1 tablet (10 mg total) by mouth at bedtime as needed for muscle spasms.     HYDROcodone-homatropine 5-1.5 MG/5ML syrup  Commonly known as:  HYCODAN  Take 5 mLs by mouth every 6 (six) hours as needed for cough.     losartan-hydrochlorothiazide 50-12.5 MG per tablet  Commonly known as:  HYZAAR  Take 1 tablet by mouth daily.     MULTIVITAMIN PO  Take 1 tablet by mouth daily.     predniSONE 10 MG tablet  Commonly known as:  DELTASONE  4 tablets x 2 days, 3 tabs x 2 days, 2 tabs x 2 days, 1 tab x 2  days           Objective:   Physical Exam BP 142/82 mmHg  Pulse 109  Temp(Src) 98.9 F (37.2 C) (Oral)  Wt 302 lb 4 oz (137.1 kg)  SpO2 95%  General:   Well developed, well nourished . Coughing frequently but not toxic appearing.  HEENT:  Normocephalic . Face symmetric, atraumatic; tympanic membranes are moderately red, worse on the left, no bulging or discharge. Nose quite congested, throat is slightly red but symmetric Lungs:  Very few rhonchi, no wheezing or actual respiratory distress Normal respiratory effort, no intercostal retractions, no accessory muscle use. Heart: Slightly tachycardic but regular,  no murmur.  Muscle skeletal: no pretibial edema bilaterally  Skin: Not pale. Not jaundice Neurologic:  alert & oriented X3.  Speech normal, gait appropriate for age and unassisted Psych--  Cognition and judgment appear intact.  Cooperative with normal attention span and concentration.  Behavior appropriate. No anxious or depressed appearing.        Assessment & Plan:   Viral syndrome, The patient probably had a viral syndrome now complicated with a ear infection worse on the left and tracheo- bronchitis. Plan: see instructions Wife is pregnant, he is still probably contagious, he is provided a mask

## 2014-10-23 NOTE — Progress Notes (Signed)
Pre visit review using our clinic review tool, if applicable. No additional management support is needed unless otherwise documented below in the visit note. 

## 2014-10-23 NOTE — Patient Instructions (Signed)
Rest, fluids , tylenol Take Mucinex DM twice a day as needed (avoid the "D") If nasal  congestion use OTC Nasocort or Flonase : 2 nasal sprays on each side of the nose daily until you feel better If the cough continue: use hydrovcodone , will cause drowsiness Take the antibiotic as prescribed  (zithromax) Call if not gradually better over the next  10 days Call anytime if the symptoms are severe

## 2015-01-23 ENCOUNTER — Ambulatory Visit (INDEPENDENT_AMBULATORY_CARE_PROVIDER_SITE_OTHER): Payer: BLUE CROSS/BLUE SHIELD | Admitting: Internal Medicine

## 2015-01-23 ENCOUNTER — Other Ambulatory Visit: Payer: Self-pay

## 2015-01-23 ENCOUNTER — Encounter: Payer: Self-pay | Admitting: Internal Medicine

## 2015-01-23 VITALS — BP 142/90 | HR 96 | Temp 98.0°F | Ht 67.0 in | Wt 316.4 lb

## 2015-01-23 DIAGNOSIS — M545 Low back pain: Secondary | ICD-10-CM | POA: Diagnosis not present

## 2015-01-23 MED ORDER — KETOROLAC TROMETHAMINE 10 MG PO TABS: 10.0000 mg | ORAL_TABLET | Freq: Four times a day (QID) | ORAL | Status: DC | PRN

## 2015-01-23 MED ORDER — HYDROCODONE-ACETAMINOPHEN 5-325 MG PO TABS: 1.0000 | ORAL_TABLET | Freq: Three times a day (TID) | ORAL | Status: DC | PRN

## 2015-01-23 MED ORDER — PREDNISONE 10 MG PO TABS: ORAL_TABLET | ORAL | Status: DC

## 2015-01-23 MED ORDER — CYCLOBENZAPRINE HCL 10 MG PO TABS: 10.0000 mg | ORAL_TABLET | Freq: Every evening | ORAL | Status: DC | PRN

## 2015-01-23 NOTE — Progress Notes (Signed)
Pre visit review using our clinic review tool, if applicable. No additional management support is needed unless otherwise documented below in the visit note. 

## 2015-01-23 NOTE — Progress Notes (Signed)
Subjective:    Patient ID: Aaron Whitehead, male    DOB: [birth year], 44 y.o.   MRN: [id number]  DOS:  01/23/2015 Type of visit - description : Acute Interval history: 3 nights ago, he fell asleep on a chair for 4-5 hours, shortly after developed back pain, mostly at the right side, some radiation to the right buttock but no radiation down to the leg. Has been very uncomfortable, pain decrease when he stand or lay down in certain positions. The pain is worse when he transitioned from one position to another or when he tries to walk. Has taken some Flexeril and it helped.   Review of Systems Denies fever chills. No injury No skin rash No bladder or bowel incontinence, no lower extremities tingling or paresthesias.    Past Medical History  Diagnosis Date  . Hypertension 2007  . Obesity   . OSA (obstructive sleep apnea)     NPSG 03/09/10 AHI 99.9/hr, CPAP 21/AHI 0.9/hr  . Unspecified asthma(493.90) 11/07/2012    Past Surgical History  Procedure Laterality Date  . Carpal tunnel release      bilat  . Adenoidectomy    . Tonsillectomy    . Laparoscopic gastric sleeve resection N/A 02/12/2013    Procedure: LARAPROSCOPIC SLEEVE GASTRECTOMY WITH EGD;  Surgeon: Lodema Pilot, DO;  Location: [facility];  Service: General;  Laterality: N/A;  LARAPROSCOPIC SLEEVE GASTRECTOMY WITH EGD  . Esophagogastroduodenoscopy N/A 02/12/2013    Procedure: ESOPHAGOGASTRODUODENOSCOPY (EGD);  Surgeon: Lodema Pilot, DO;  Location: [facility];  Service: General;  Laterality: N/A;    History   Social History  . Marital Status: Married    Spouse Name: N/A  . Number of Children: 4  . Years of Education: N/A   Occupational History  . 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 Smoking status: Former Smoker -- 0.50 packs/day for 8 years    Types: Cigarettes    Quit date: 08/08/1992  . Smokeless tobacco: Never Used     Comment: smoked 1988-1996, up to 1/2 ppd.   . Alcohol Use: 0.0 oz/week      0 Standard drinks or equivalent per week     Comment: rare   . Drug Use: No  . Sexual Activity: Not on file   Other Topics Concern  . Not on file   Social History Narrative   Divorced, remarried , wife pregnant              Medication List       This list is accurate as of: 01/23/15 11:36 AM.  Always use your most recent med list.               cyclobenzaprine 10 MG tablet  Commonly known as:  FLEXERIL  Take 1 tablet (10 mg total) by mouth at bedtime as needed for muscle spasms.     HYDROcodone-acetaminophen 5-325 MG per tablet  Commonly known as:  NORCO/VICODIN  Take 1-2 tablets by mouth every 8 (eight) hours as needed.     ketorolac 10 MG tablet  Commonly known as:  TORADOL  Take 1 tablet (10 mg total) by mouth every 6 (six) hours as needed.     losartan-hydrochlorothiazide 50-12.5 MG per tablet  Commonly known as:  HYZAAR  Take 1 tablet by mouth daily.     MULTIVITAMIN PO  Take 1 tablet by mouth daily.     predniSONE 10 MG tablet  Commonly  known as:  DELTASONE  5 tabs x 2 days, 4 tablets x 2 days, 3 tabs x 2 days, 2 tabs x 2 days, 1 tab x 2 days           Objective:   Physical Exam BP 142/90 mmHg  Pulse 96  Temp(Src) 98 F (36.7 C) (Oral)  Ht 5\' 7"  (1.702 m)  Wt 316 lb 6 oz (143.507 kg)  BMI 49.54 kg/m2  SpO2 97% General:   Well developed, well nourished, + antalgic posterior and gait.  HEENT:  Normocephalic . Face symmetric, atraumatic Extremities: No edema Skin: Not pale. Not jaundice Neurologic:  alert & oriented X3.  Speech normal, gait antalgic but  Unassisted DTRs symmetric Motor strength symmetric Negative straight leg test, he did complain of increased back pain with stretching right leg Psych--  Cognition and judgment appear intact.  Cooperative with normal attention span and concentration.  Behavior appropriate. No anxious or depressed appearing.        Assessment & Plan:    Back pain, Has a history of on and off  back pain but this is probably worse episode he had. Neurological exam is nonfocal. Recommend treatment with prednisone, Flexeril, Toradol, Vicodin. See instructions Long-term treatment will be physical therapy for strengthening of the core muscles and weight loss. Patient verbalizes understanding. Will call if not improving soon  BP today slightly elevated, likely from pain, at home ambulatory BPs 130/80

## 2015-01-23 NOTE — Patient Instructions (Addendum)
Request for few days  Warm compress to the back  Prednisone as prescribed for few days  For pain: Take Toradol (prescription) or Tylenol OTC. These medicines will not make her sleepy Take Toradol always with food because it may upset your stomach  Flexeril at bedtime  If the pain continues okay to take hydrocodone, will make you drowsy. Be careful  If not gradually improving or if you get worse: Please call us

## 2015-01-27 ENCOUNTER — Ambulatory Visit: Payer: Self-pay | Admitting: Internal Medicine

## 2015-03-16 ENCOUNTER — Ambulatory Visit (INDEPENDENT_AMBULATORY_CARE_PROVIDER_SITE_OTHER): Payer: BLUE CROSS/BLUE SHIELD | Admitting: Family

## 2015-03-16 ENCOUNTER — Encounter: Payer: Self-pay | Admitting: Family

## 2015-03-16 VITALS — BP 134/84 | HR 82 | Temp 98.4°F | Resp 18 | Ht 67.0 in | Wt 325.0 lb

## 2015-03-16 DIAGNOSIS — L255 Unspecified contact dermatitis due to plants, except food: Secondary | ICD-10-CM | POA: Insufficient documentation

## 2015-03-16 MED ORDER — PREDNISONE 10 MG PO TABS: ORAL_TABLET | ORAL | Status: DC

## 2015-03-16 MED ORDER — METHYLPREDNISOLONE SODIUM SUCC 125 MG IJ SOLR
125.0000 mg | Freq: Once | INTRAMUSCULAR | Status: AC
  Administered 2015-03-16: 125 mg via INTRAMUSCULAR

## 2015-03-16 NOTE — Progress Notes (Signed)
Subjective:    Patient ID: Aaron Whitehead, male    DOB: 1970/08/29, 44 y.o.   MRN: [id number]  HPI  [name] is a 44 yr old male who presents today with rash around eyes.  Saturday cleaned the yard.  Last night woke up itching in the middle of the night. Used zyrtec which helped.  R eye has been tearing but if he blinks vision blurring resolves.  He notes a few  spots on forearms.     Review of Systems See HPI  Past Medical History  Diagnosis Date  . Hypertension 2007  . Obesity   . OSA (obstructive sleep apnea)     NPSG 03/09/10 AHI 99.9/hr, CPAP 21/AHI 0.9/hr  . Unspecified asthma(493.90) 11/07/2012    History   Social History  . Marital Status: Married    Spouse Name: N/A  . Number of Children: 4  . Years of Education: N/A   Occupational History  . 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 Smoking status: Former Smoker -- 0.50 packs/day for 8 years    Types: Cigarettes    Quit date: 08/08/1992  . Smokeless tobacco: Never Used     Comment: smoked 1988-1996, up to 1/2 ppd.   . Alcohol Use: 0.0 oz/week    0 Standard drinks or equivalent per week     Comment: rare   . Drug Use: No  . Sexual Activity: Not on file   Other Topics Concern  . Not on file   Social History Narrative   Divorced, remarried , wife pregnant          Past Surgical History  Procedure Laterality Date  . Carpal tunnel release      bilat  . Adenoidectomy    . Tonsillectomy    . Laparoscopic gastric sleeve resection N/A 02/12/2013    Procedure: LARAPROSCOPIC SLEEVE GASTRECTOMY WITH EGD;  Surgeon: Lodema Pilot, DO;  Location: [facility];  Service: General;  Laterality: N/A;  LARAPROSCOPIC SLEEVE GASTRECTOMY WITH EGD  . Esophagogastroduodenoscopy N/A 02/12/2013    Procedure: ESOPHAGOGASTRODUODENOSCOPY (EGD);  Surgeon: Lodema Pilot, DO;  Location: [facility];  Service: General;  Laterality: N/A;    Family History  Problem Relation Age of Onset  . Diabetes Other     GM  .  Heart attack      GF  . Prostate cancer Neg Hx   . Colon cancer Neg Hx   . COPD Neg Hx   . Hypertension Mother   . Asthma Mother   . Coronary artery disease Father     CAD ~ age 59, had cardiomegaly +smoker     No Known Allergies  Current Outpatient Prescriptions on File Prior to Visit  Medication Sig Dispense Refill  . losartan-hydrochlorothiazide (HYZAAR) 50-12.5 MG per tablet Take 1 tablet by mouth daily. 30 tablet 12  . Multiple Vitamins-Minerals (MULTIVITAMIN PO) Take 1 tablet by mouth daily.     No current facility-administered medications on file prior to visit.    BP 134/84 mmHg  Pulse 82  Temp(Src) 98.4 F (36.9 C) (Oral)  Resp 18  Ht  (1.702 m)  Wt 325 lb (147.419 kg)  BMI 50.89 kg/m2  SpO2 98%       Objective:   Physical Exam  Constitutional: He is oriented to person, place, and time. He appears well-developed and well-nourished. No distress.  Eyes: Left eye exhibits no discharge. Right conjunctiva is not injected.  Right conjunctiva has no hemorrhage. Left conjunctiva is not injected. Left conjunctiva has no hemorrhage.  Neurological: He is alert and oriented to person, place, and time.  Skin:  Rash noted on bilateral eye lids and across bridge of nose  Psychiatric: He has a normal mood and affect. His behavior is normal. Judgment and thought content normal.          Assessment & Plan:

## 2015-03-16 NOTE — Patient Instructions (Signed)
Start prednisone tomorrow AM Continue zyrtec  every 24 hours. If severe itching occurs, you can take benadryl  (one tab) by mouth every 6 hours as needed. Call if increased rash/swelling/pain/itching redness, vision changes or if symptoms do not continue to improve.

## 2015-03-16 NOTE — Progress Notes (Signed)
Pre visit review using our clinic review tool, if applicable. No additional management support is needed unless otherwise documented below in the visit note. 

## 2015-03-16 NOTE — Assessment & Plan Note (Signed)
IM solumedrol  here today in the office to be followed by a short burst of prednisone.  Advised pt Start prednisone tomorrow AM Continue zyrtec  every 24 hours. If severe itching occurs, you can take benadryl  (one tab) by mouth every 6 hours as needed. Call if increased rash/swelling/pain/itching redness, vision changes or if symptoms do not continue to improve.

## 2015-03-16 NOTE — Addendum Note (Signed)
Addended by: Mervin Kung A on: 03/16/2015 02:32 PM   Modules accepted: Orders

## 2015-06-07 IMAGING — CR DG CHEST 2V
2 series · 2 of 2 positions shown · non-contrast
Comparison: None.

CLINICAL DATA: Preoperative respiratory examination for sleeve
gastrectomy.

CHEST - 2 VIEW

[w chest pa]
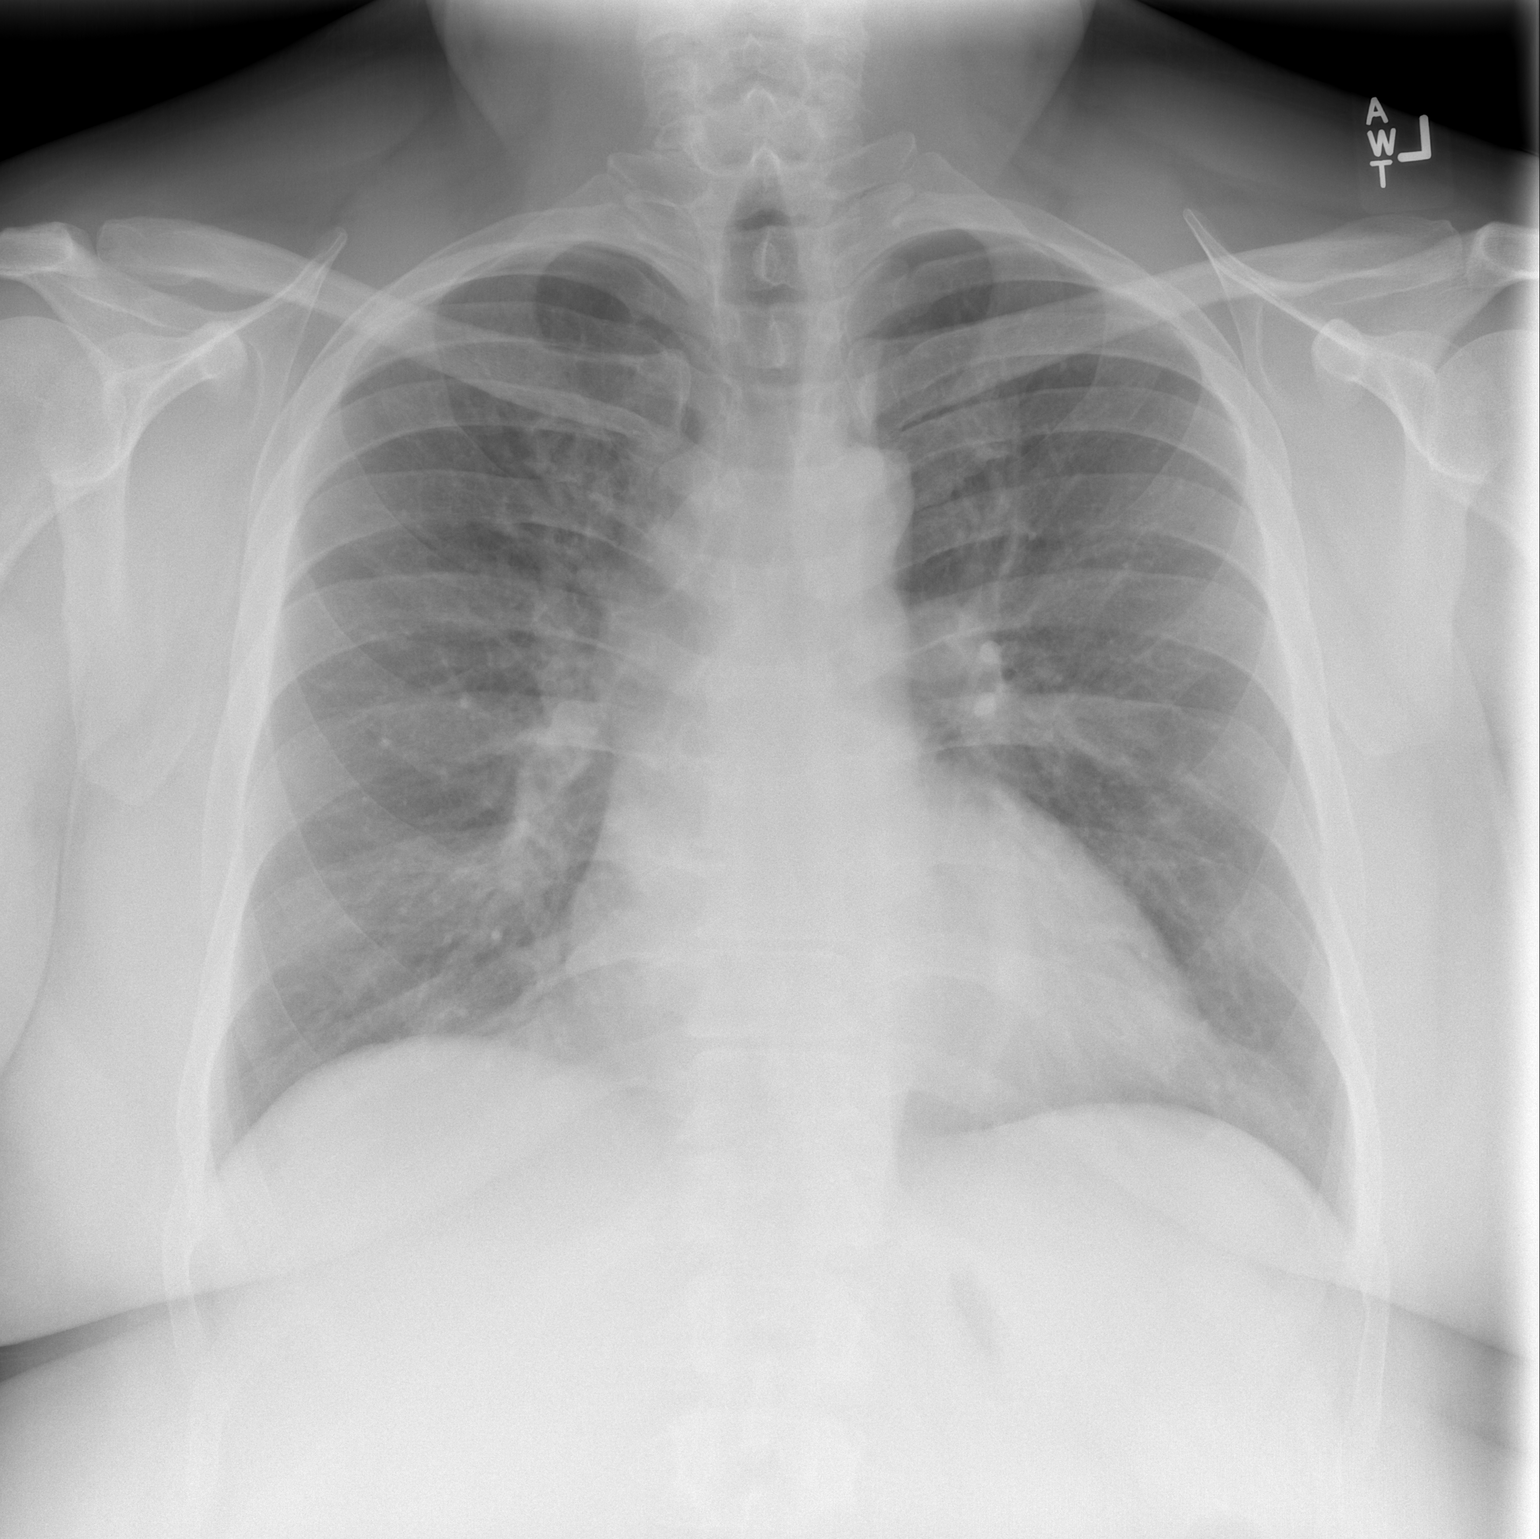

[w chest lat]
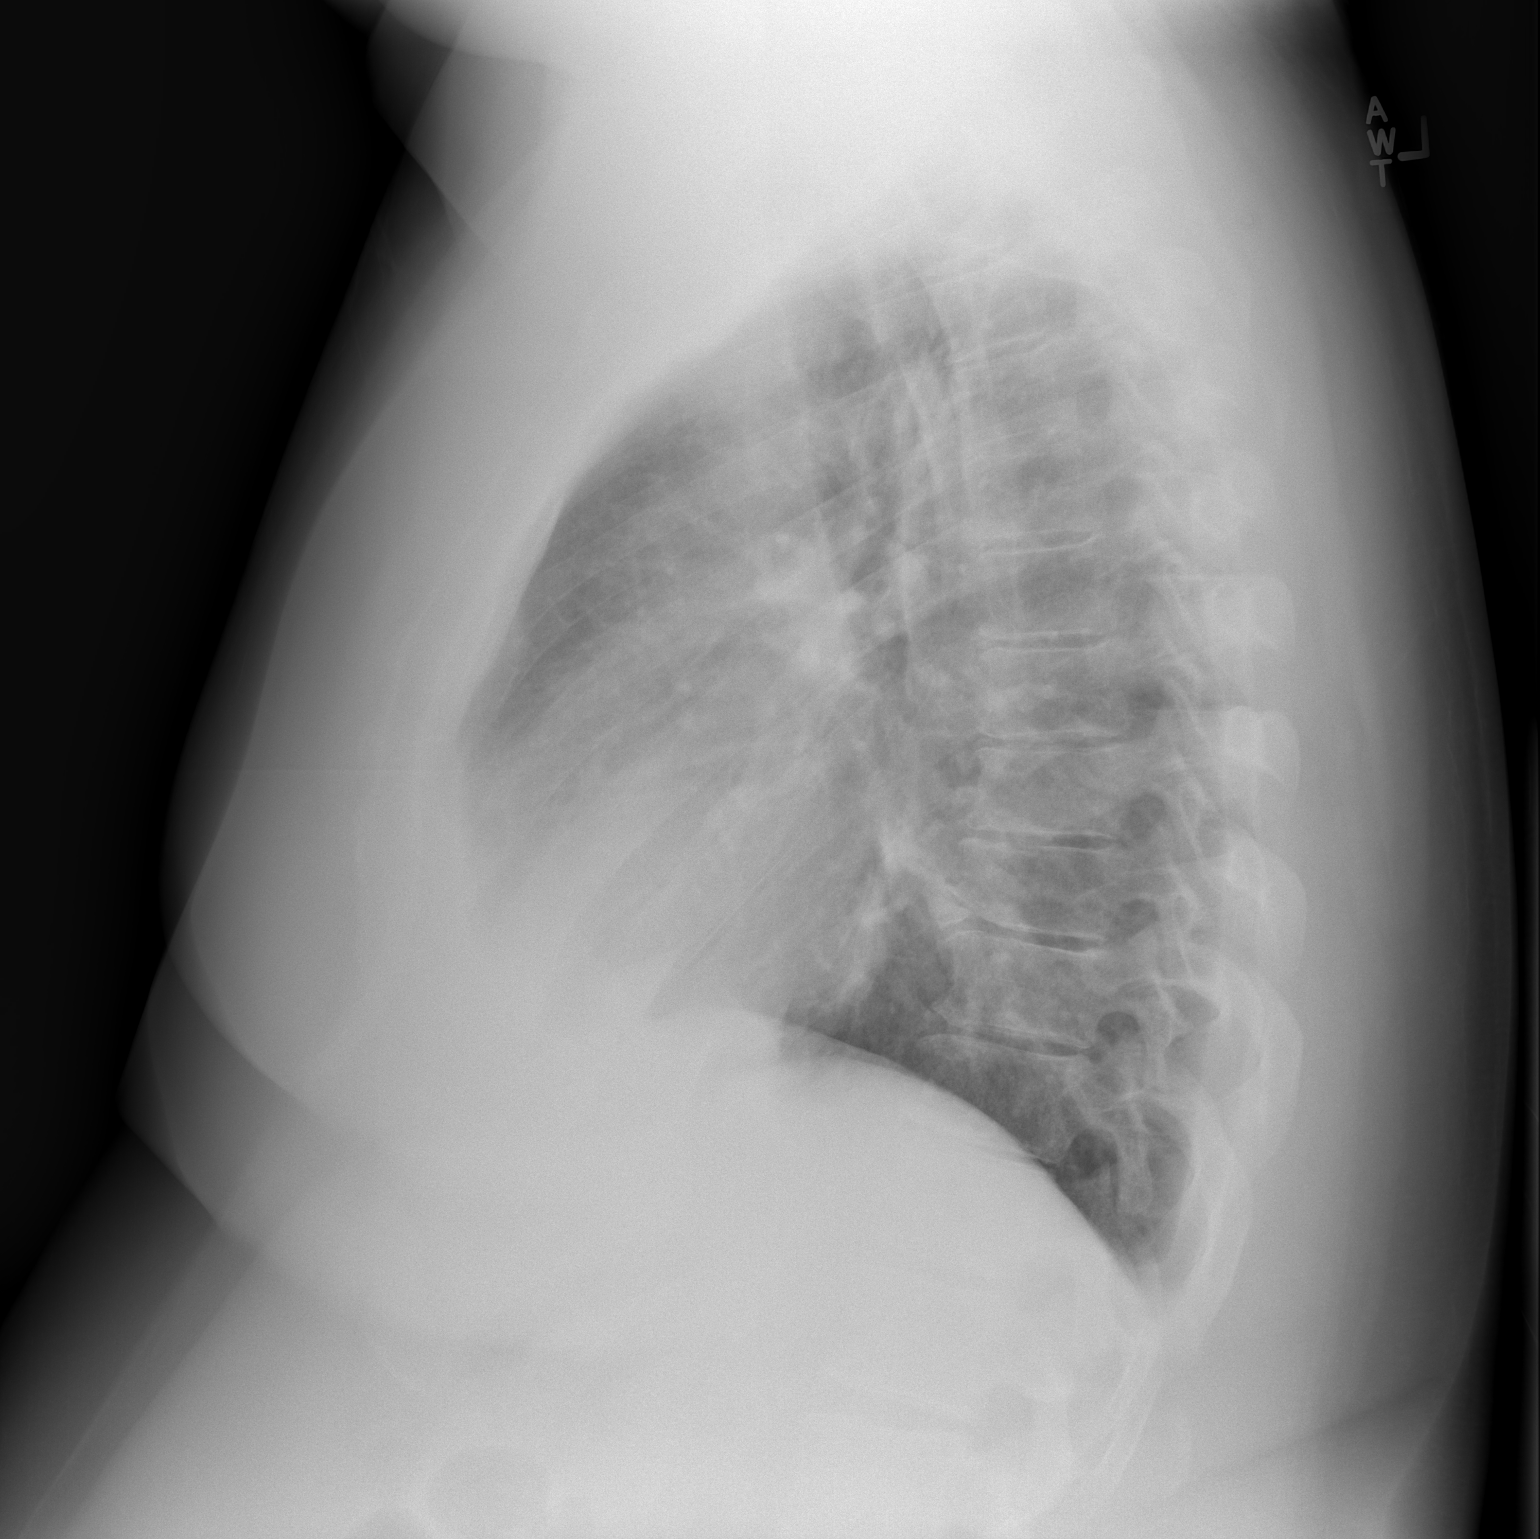

[2 of 2 positions shown; findings below may reference images not displayed]

FINDINGS: The heart size is at the upper limits of normal.  The
mediastinal contours are normal.  The lungs are clear and there is
no pleural effusion.  Osseous structures appear normal.
IMPRESSION: Borderline heart size.  No acute cardiopulmonary process.

## 2015-10-07 ENCOUNTER — Telehealth: Payer: Self-pay | Admitting: Internal Medicine

## 2015-10-07 NOTE — Telephone Encounter (Signed)
Flu shot declined in chart.  

## 2015-10-07 NOTE — Telephone Encounter (Signed)
10-07-15 Pt declined FLU SHOT.

## 2015-10-07 NOTE — Telephone Encounter (Signed)
Letter printed and mailed to Pt, informing him he is overdue for CPE.  

## 2015-11-09 ENCOUNTER — Encounter: Payer: Self-pay | Admitting: Internal Medicine

## 2015-11-09 ENCOUNTER — Telehealth: Payer: Self-pay

## 2015-11-09 ENCOUNTER — Ambulatory Visit (INDEPENDENT_AMBULATORY_CARE_PROVIDER_SITE_OTHER): Payer: BLUE CROSS/BLUE SHIELD | Admitting: Internal Medicine

## 2015-11-09 VITALS — BP 126/82 | HR 91 | Temp 98.0°F | Ht 67.0 in | Wt 320.1 lb

## 2015-11-09 DIAGNOSIS — M546 Pain in thoracic spine: Secondary | ICD-10-CM

## 2015-11-09 DIAGNOSIS — Z09 Encounter for follow-up examination after completed treatment for conditions other than malignant neoplasm: Secondary | ICD-10-CM | POA: Insufficient documentation

## 2015-11-09 DIAGNOSIS — M549 Dorsalgia, unspecified: Secondary | ICD-10-CM

## 2015-11-09 MED ORDER — LORCASERIN HCL 10 MG PO TABS: 1.0000 | ORAL_TABLET | Freq: Two times a day (BID) | ORAL | Status: DC

## 2015-11-09 MED ORDER — CYCLOBENZAPRINE HCL 10 MG PO TABS: 10.0000 mg | ORAL_TABLET | Freq: Every evening | ORAL | Status: AC | PRN

## 2015-11-09 MED ORDER — PREDNISONE 10 MG PO TABS: ORAL_TABLET | ORAL | Status: AC

## 2015-11-09 NOTE — Telephone Encounter (Signed)
Error

## 2015-11-09 NOTE — Addendum Note (Signed)
Addended by: Conrad BurlingtonANTER, Cameryn Schum D on: 11/09/2015 11:21 AM   Modules accepted: Orders, Medications

## 2015-11-09 NOTE — Assessment & Plan Note (Signed)
Upper back pain, trapezoid pain versus radiculopathy (less likely). Neuro exam normal. plan: Flexeril, prednisone, stretching, Tylenol, Motrin. If not better he will let me know Obesity: Has gained weight lately, states is having difficulty with diet and exercise --->  lost his job and  schedule is difficult.  Recommend to RTC to discuss further.

## 2015-11-09 NOTE — Patient Instructions (Signed)
Use a heating pad twice a day  Stretching twice a day  Flexeril at bedtime to help with the pain, it is a muscle relaxant, will cause drowsiness  Prednisone as prescribed  For pain take either Tylenol or ibuprofen, see instructions below: Tylenol  500 mg OTC 2 tabs a day every 8 hours as needed for pain  IBUPROFEN (Advil or Motrin) 200 mg 2 tablets every 6 hours as needed for pain.  Always take it with food because may cause gastritis and ulcers.  If you notice nausea, stomach pain, change in the color of stools --->  Stop the medicine and let us know  If you are not better in the next 2 weeks please call the office  You are due for routine checkup for blood pressure, please schedule it at your earliest convenience

## 2015-11-09 NOTE — Telephone Encounter (Signed)
send Belviq 10 mg 1 po bid #60, 2 RF, pt knows already  Received: Today    Wanda PlumpJose E Paz, MD  Conrad BurlingtonKaylyn Auguste Tebbetts, CMA   Rx sent.

## 2015-11-09 NOTE — Telephone Encounter (Signed)
Disregard telephone note below.

## 2015-11-09 NOTE — Progress Notes (Signed)
Pre visit review using our clinic review tool, if applicable. No additional management support is needed unless otherwise documented below in the visit note. 

## 2015-11-09 NOTE — Progress Notes (Signed)
Subjective:    Patient ID: Aaron Whitehead, male    DOB: Dec 22, 1970, 45 y.o.   MRN: [id number]  DOS:  11/09/2015 Type of visit - description : Acute visit Interval history: Symptoms started 3 days ago with pain at the right upper trapezoid area with radiation to the R and lower areas. Pain is steady, change with certain positions especially when he tries to reach out with his arms. No injury, fall. No upper or lower extremity paresthesias or weakness No gait disorder or discoordination. No rash. No pain in the neck area per se.  Wt Readings from Last 3 Encounters:  11/09/15 320 lb 2 oz (145.208 kg)  03/16/15 325 lb (147.419 kg)  01/23/15 316 lb 6 oz (143.507 kg)     Review of Systems See history of present illness  Past Medical History  Diagnosis Date  . Hypertension 2007  . Obesity   . OSA (obstructive sleep apnea)     NPSG 03/09/10 AHI 99.9/hr, CPAP 21/AHI 0.9/hr  . Unspecified asthma(493.90) 11/07/2012    Past Surgical History  Procedure Laterality Date  . Carpal tunnel release      bilat  . Adenoidectomy    . Tonsillectomy    . Laparoscopic gastric sleeve resection N/A 02/12/2013    Procedure: LARAPROSCOPIC SLEEVE GASTRECTOMY WITH EGD;  Surgeon: Lodema Pilot, DO;  Location: [facility];  Service: General;  Laterality: N/A;  LARAPROSCOPIC SLEEVE GASTRECTOMY WITH EGD  . Esophagogastroduodenoscopy N/A 02/12/2013    Procedure: ESOPHAGOGASTRODUODENOSCOPY (EGD);  Surgeon: Lodema Pilot, DO;  Location: [facility];  Service: General;  Laterality: N/A;    Social History   Social History  . Marital Status: Married    Spouse Name: N/A  . Number of Children: 4  . Years of Education: N/A   Occupational History  . 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 Smoking status: Former Smoker -- 0.50 packs/day for 8 years    Types: Cigarettes    Quit date: 08/08/1992  . Smokeless tobacco: Never Used     Comment: smoked 1988-1996, up to 1/2 ppd.   . Alcohol Use:  0.0 oz/week    0 Standard drinks or equivalent per week     Comment: rare   . Drug Use: No  . Sexual Activity: Not on file   Other Topics Concern  . Not on file   Social History Narrative   Divorced, remarried , wife pregnant              Medication List       This list is accurate as of: 11/09/15  1:30 PM.  Always use your most recent med list.               cyclobenzaprine 10 MG tablet  Commonly known as:  FLEXERIL  Take 1 tablet (10 mg total) by mouth at bedtime as needed for muscle spasms.     losartan-hydrochlorothiazide 50-12.5 MG tablet  Commonly known as:  HYZAAR  Take 1 tablet by mouth daily.     MULTIVITAMIN PO  Take 1 tablet by mouth daily.     predniSONE 10 MG tablet  Commonly known as:  DELTASONE  4 tablets x 2 days, 3 tabs x 2 days, 2 tabs x 2 days, 1 tab x 2 days           Objective:   Physical Exam  Musculoskeletal:       Arms:  BP  126/82 mmHg  Pulse 91  Temp(Src) 98 F (36.7 C) (Oral)  Ht 5\' 7"  (1.702 m)  Wt 320 lb 2 oz (145.208 kg)  BMI 50.13 kg/m2  SpO2 95% General:   Well developed, well nourished . NAD.  HEENT:  Normocephalic . Face symmetric, atraumatic MSK: No TTP at the thoracic or cervical spine Skin: Not pale. Not jaundice Neurologic:  alert & oriented X3.  Speech normal, gait appropriate for age and unassisted. DTRs symmetric, strength symmetric Psych--  Cognition and judgment appear intact.  Cooperative with normal attention span and concentration.  Behavior appropriate. No anxious or depressed appearing.      Assessment & Plan:   Assessment HTN Sleep apnea Asthma Morbid obesity, S/P bariatric surgery (wt was ~ 350 lb)  Plan: Upper back pain, trapezoid pain versus radiculopathy (less likely). Neuro exam normal. plan: Flexeril, prednisone, stretching, Tylenol, Motrin. If not better he will let me know Obesity: Has gained weight lately, states is having difficulty with diet and exercise --->  lost his job and   schedule is difficult.  Recommend to RTC to discuss further.

## 2016-05-16 ENCOUNTER — Encounter (HOSPITAL_COMMUNITY): Payer: Self-pay

## 2016-07-25 ENCOUNTER — Telehealth: Payer: Self-pay | Admitting: Internal Medicine

## 2016-07-25 MED ORDER — OSELTAMIVIR PHOSPHATE 75 MG PO CAPS: 75.0000 mg | ORAL_CAPSULE | Freq: Every day | ORAL | 0 refills | Status: DC

## 2016-07-25 NOTE — Telephone Encounter (Signed)
Spoke w/ Pt, informed him of recommendations. [facility]. Pt aware if he becomes symptomatic he will need to come in for visit. Pt verbalized understanding.

## 2016-07-25 NOTE — Telephone Encounter (Signed)
The only thing I can offer is Tamiflu 75 mg one tablet daily for 10 days #10 no refills. If he gets sick let me know.

## 2016-07-25 NOTE — Telephone Encounter (Signed)
Please advise 

## 2016-07-25 NOTE — Telephone Encounter (Signed)
Caller name: Relationship to patient: Self Can be reached: [phone] [facility]  [facility] [place] [address] [phone] (Phone) [phone] (Fax)    Reason for call: States his wife tested positive for the flu and he want to know if something can be called in for him

## 2017-09-19 ENCOUNTER — Encounter (HOSPITAL_COMMUNITY): Payer: Self-pay

## 2018-07-24 ENCOUNTER — Ambulatory Visit: Payer: Self-pay | Admitting: *Deleted

## 2018-07-24 NOTE — Telephone Encounter (Signed)
Unfortunately Pt has not been seen in over 2.5 years. Unable to send meds.

## 2018-07-24 NOTE — Telephone Encounter (Signed)
Pt requesting Tamiflu be called in for him. States his daughter just tested positive for flu. His wife did get RX for tamiflu for herself from her PCP. States his main concern is for their 49 year old who is just getting over bronchitis.  If appropriate:  [facility] [address] [place]. Pt can be reached at [phone] if further questions.

## 2018-07-25 ENCOUNTER — Telehealth: Payer: Self-pay | Admitting: Internal Medicine

## 2018-07-25 MED ORDER — OSELTAMIVIR PHOSPHATE 75 MG PO CAPS: 75.0000 mg | ORAL_CAPSULE | Freq: Every day | ORAL | 0 refills | Status: DC

## 2018-07-25 NOTE — Telephone Encounter (Signed)
Rx sent. LMOM informing Pt that [facility]. Instructed it is important that he still keep appt for 07/27/2018.

## 2018-07-25 NOTE — Telephone Encounter (Signed)
Duplicate note

## 2018-07-25 NOTE — Telephone Encounter (Signed)
Please advise 

## 2018-07-25 NOTE — Telephone Encounter (Signed)
Let patient know, we are sending Tamiflu 75 mg 1 p.o. daily for 10 days, #10 no refills

## 2018-07-25 NOTE — Telephone Encounter (Signed)
Copied from CRM #[id number]. Topic: Quick Communication - See Telephone Encounter >> Jul 25, 2018  3:26 PM Fancy Dunkley N, NT wrote: CRM for notification. See Telephone encounter for: 07/25/18. Unable to addend to nurse triage note. Pt scheduled an appt for 07/27/18 for a physical and would like to see if the tamiful may be sent to his pharmacy? Please advise pt. 

## 2018-07-25 NOTE — Addendum Note (Signed)
Addended byConrad Fertile: Arrianna Catala D on: 07/25/2018 03:49 PM   Modules accepted: Orders

## 2018-07-25 NOTE — Telephone Encounter (Signed)
Copied from CRM [id number]. Topic: Quick Communication - See Telephone Encounter >> Jul 25, 2018  3:26 PM Arlyss Gandyichardson, Taren N, NT wrote: CRM for notification. See Telephone encounter for: 07/25/18. Unable to addend to nurse triage note. Pt scheduled an appt for 07/27/18 for a physical and would like to see if the tamiful may be sent to his pharmacy? Please advise pt.

## 2018-07-27 ENCOUNTER — Encounter: Payer: Self-pay | Admitting: Internal Medicine

## 2018-07-27 ENCOUNTER — Encounter (INDEPENDENT_AMBULATORY_CARE_PROVIDER_SITE_OTHER): Payer: Self-pay | Admitting: Internal Medicine

## 2018-07-27 DIAGNOSIS — R5383 Other fatigue: Secondary | ICD-10-CM

## 2018-07-27 NOTE — Progress Notes (Signed)
Pt having issues w/ insurance- does not want to pay out of pocket. He will reschedule.

## 2018-08-22 ENCOUNTER — Encounter: Payer: Self-pay | Admitting: Internal Medicine

## 2018-09-12 ENCOUNTER — Encounter: Payer: Self-pay | Admitting: Internal Medicine

## 2018-09-12 ENCOUNTER — Ambulatory Visit (INDEPENDENT_AMBULATORY_CARE_PROVIDER_SITE_OTHER): Payer: BLUE CROSS/BLUE SHIELD | Admitting: Family Medicine

## 2018-09-12 ENCOUNTER — Encounter: Payer: Self-pay | Admitting: Family Medicine

## 2018-09-12 VITALS — BP 140/90 | HR 115 | Temp 99.6°F | Ht 67.0 in | Wt 304.5 lb

## 2018-09-12 DIAGNOSIS — R6889 Other general symptoms and signs: Secondary | ICD-10-CM | POA: Diagnosis not present

## 2018-09-12 MED ORDER — BENZONATATE 100 MG PO CAPS: 100.0000 mg | ORAL_CAPSULE | Freq: Three times a day (TID) | ORAL | 0 refills | Status: AC | PRN

## 2018-09-12 MED ORDER — OSELTAMIVIR PHOSPHATE 75 MG PO CAPS: 75.0000 mg | ORAL_CAPSULE | Freq: Two times a day (BID) | ORAL | 0 refills | Status: AC

## 2018-09-12 NOTE — Patient Instructions (Addendum)
Continue to push fluids, practice good hand hygiene, and cover your mouth if you cough.  If you start having fevers, shaking or shortness of breath, seek immediate care.  OK to take Tylenol 1000 mg (2 extra strength tabs) or 975 mg (3 regular strength tabs) every 6 hours as needed.  Ibuprofen 400-600 mg (2-3 over the counter strength tabs) every 6 hours as needed for pain.  For symptoms, consider using Vick's VapoRub on chest or under nose, air humidifier, Benadryl at night, and elevating the head of the bed. Tylenol and ibuprofen for aches and pains you may be experiencing.   Let us know if you need anything.  

## 2018-09-12 NOTE — Progress Notes (Signed)
Chief Complaint  Patient presents with  . Cough    Aaron Whitehead here for URI complaints.  Duration: This AM Associated symptoms: subjective fever, rhinorrhea, shortness of breath, myalgia and cough Denies: sinus congestion, sinus pain, itchy watery eyes, ear pain, ear drainage, sore throat and wheezing Treatment to date: None Sick contacts: No  He has an upcoming appointment with his regular PCP to discuss hypertension.   ROS:  Const: +fevers  HEENT: As noted in HPI Lungs: No SOB  Past Medical History:  Diagnosis Date  . Hypertension 2007  . Obesity   . OSA (obstructive sleep apnea)    NPSG 03/09/10 AHI 99.9/hr, CPAP 21/AHI 0.9/hr  . Unspecified asthma(493.90) 11/07/2012    BP 140/90 (BP Location: Left Arm, Patient Position: Sitting, Cuff Size: Large)   Pulse (!) 115   Temp 99.6 F (37.6 C) (Oral)   Ht 5\' 7"  (1.702 m)   Wt (!) 304 lb 8 oz (138.1 kg)   SpO2 96%   BMI 47.69 kg/m  General: Awake, alert, appears stated age HEENT: AT, [place], ears patent b/l and TM's neg, nares patent w/o discharge, pharynx pink and without exudates, MMM Neck: No masses or asymmetry Heart: RRR Lungs: CTAB, no accessory muscle use Psych: Age appropriate judgment and insight, normal mood and affect  Flu-like symptoms - Plan: oseltamivir (TAMIFLU) 75 MG capsule, benzonatate (TESSALON) 100 MG capsule  Orders as above. Continue to push fluids, practice good hand hygiene, cover mouth when coughing. Letter for work given. F/u prn. If starting to experience fevers, shaking, or shortness of breath, seek immediate care. Pt voiced understanding and agreement to the plan.  Jilda Roche [place], DO 09/12/18 11:39 AM

## 2018-09-20 ENCOUNTER — Encounter: Payer: Self-pay | Admitting: Internal Medicine

## 2018-09-27 ENCOUNTER — Encounter: Payer: Self-pay | Admitting: Internal Medicine

## 2018-10-19 ENCOUNTER — Encounter: Payer: Self-pay | Admitting: Internal Medicine

## 2019-02-21 ENCOUNTER — Other Ambulatory Visit: Payer: Self-pay | Admitting: Internal Medicine

## 2019-02-21 DIAGNOSIS — Z20822 Contact with and (suspected) exposure to covid-19: Secondary | ICD-10-CM

## 2019-02-27 LAB — NOVEL CORONAVIRUS, NAA: SARS-CoV-2, NAA: NOT DETECTED

## 2019-03-04 ENCOUNTER — Encounter: Payer: Self-pay | Admitting: Internal Medicine
